# Patient Record
Sex: Female | Born: 1960 | Race: White | Hispanic: No | Marital: Single | Smoking: Current every day smoker
Health system: Southern US, Community
[De-identification: ages and names within clinical notes are randomized; demographics above are authoritative.]

## PROBLEM LIST (undated history)

## (undated) DIAGNOSIS — G894 Chronic pain syndrome: Secondary | ICD-10-CM

## (undated) DIAGNOSIS — M961 Postlaminectomy syndrome, not elsewhere classified: Secondary | ICD-10-CM

## (undated) DIAGNOSIS — M1990 Unspecified osteoarthritis, unspecified site: Secondary | ICD-10-CM

## (undated) DIAGNOSIS — M4800 Spinal stenosis, site unspecified: Secondary | ICD-10-CM

## (undated) DIAGNOSIS — M51369 Other intervertebral disc degeneration, lumbar region without mention of lumbar back pain or lower extremity pain: Secondary | ICD-10-CM

## (undated) DIAGNOSIS — M546 Pain in thoracic spine: Secondary | ICD-10-CM

## (undated) DIAGNOSIS — E7800 Pure hypercholesterolemia, unspecified: Secondary | ICD-10-CM

## (undated) DIAGNOSIS — T8859XA Other complications of anesthesia, initial encounter: Secondary | ICD-10-CM

## (undated) DIAGNOSIS — Z87898 Personal history of other specified conditions: Secondary | ICD-10-CM

## (undated) DIAGNOSIS — J439 Emphysema, unspecified: Secondary | ICD-10-CM

## (undated) DIAGNOSIS — K219 Gastro-esophageal reflux disease without esophagitis: Secondary | ICD-10-CM

## (undated) DIAGNOSIS — M542 Cervicalgia: Secondary | ICD-10-CM

## (undated) DIAGNOSIS — G43909 Migraine, unspecified, not intractable, without status migrainosus: Secondary | ICD-10-CM

## (undated) DIAGNOSIS — J449 Chronic obstructive pulmonary disease, unspecified: Secondary | ICD-10-CM

## (undated) DIAGNOSIS — M5136 Other intervertebral disc degeneration, lumbar region: Secondary | ICD-10-CM

## (undated) HISTORY — DX: Unspecified osteoarthritis, unspecified site: M19.90

## (undated) HISTORY — PX: APPENDECTOMY: SHX54

## (undated) HISTORY — DX: Other intervertebral disc degeneration, lumbar region without mention of lumbar back pain or lower extremity pain: M51.369

## (undated) HISTORY — DX: Other intervertebral disc degeneration, lumbar region: M51.36

## (undated) HISTORY — DX: Postlaminectomy syndrome, not elsewhere classified: M96.1

## (undated) HISTORY — PX: OTHER SURGICAL HISTORY: SHX169

## (undated) HISTORY — DX: Other complications of anesthesia, initial encounter: T88.59XA

## (undated) HISTORY — DX: Personal history of other specified conditions: Z87.898

## (undated) HISTORY — DX: Pure hypercholesterolemia, unspecified: E78.00

## (undated) HISTORY — DX: Spinal stenosis, site unspecified: M48.00

## (undated) HISTORY — DX: Chronic pain syndrome: G89.4

## (undated) HISTORY — DX: Pain in thoracic spine: M54.6

## (undated) HISTORY — DX: Cervicalgia: M54.2

## (undated) HISTORY — DX: Chronic obstructive pulmonary disease, unspecified: J44.9

## (undated) HISTORY — PX: BREAST CYST EXCISION: SHX579

## (undated) HISTORY — DX: Emphysema, unspecified: J43.9

## (undated) HISTORY — DX: Gastro-esophageal reflux disease without esophagitis: K21.9

## (undated) HISTORY — PX: TONSILLECTOMY: SUR1361

## (undated) HISTORY — DX: Migraine, unspecified, not intractable, without status migrainosus: G43.909

---

## 1999-01-16 ENCOUNTER — Encounter (HOSPITAL_BASED_OUTPATIENT_CLINIC_OR_DEPARTMENT_OTHER): Payer: Self-pay | Admitting: General Surgery

## 1999-01-16 ENCOUNTER — Encounter: Admission: RE | Admit: 1999-01-16 | Discharge: 1999-01-16 | Payer: Self-pay | Admitting: General Surgery

## 1999-01-17 ENCOUNTER — Encounter (INDEPENDENT_AMBULATORY_CARE_PROVIDER_SITE_OTHER): Payer: Self-pay | Admitting: Specialist

## 1999-01-17 ENCOUNTER — Ambulatory Visit (HOSPITAL_BASED_OUTPATIENT_CLINIC_OR_DEPARTMENT_OTHER): Admission: RE | Admit: 1999-01-17 | Discharge: 1999-01-17 | Payer: Self-pay | Admitting: General Surgery

## 2004-08-01 ENCOUNTER — Ambulatory Visit: Payer: Self-pay | Admitting: Unknown Physician Specialty

## 2004-08-06 ENCOUNTER — Ambulatory Visit: Payer: Self-pay | Admitting: Unknown Physician Specialty

## 2004-11-29 ENCOUNTER — Ambulatory Visit: Payer: Self-pay | Admitting: Unknown Physician Specialty

## 2005-11-05 ENCOUNTER — Ambulatory Visit: Payer: Self-pay | Admitting: Internal Medicine

## 2005-11-08 ENCOUNTER — Encounter: Payer: Self-pay | Admitting: Internal Medicine

## 2005-11-08 LAB — CONVERTED CEMR LAB

## 2006-02-19 ENCOUNTER — Encounter: Admission: RE | Admit: 2006-02-19 | Discharge: 2006-02-19 | Payer: Self-pay | Admitting: *Deleted

## 2006-07-07 ENCOUNTER — Ambulatory Visit: Payer: Self-pay | Admitting: Internal Medicine

## 2006-12-01 ENCOUNTER — Emergency Department (HOSPITAL_COMMUNITY): Admission: EM | Admit: 2006-12-01 | Discharge: 2006-12-01 | Payer: Self-pay | Admitting: Emergency Medicine

## 2006-12-07 ENCOUNTER — Inpatient Hospital Stay (HOSPITAL_COMMUNITY): Admission: EM | Admit: 2006-12-07 | Discharge: 2006-12-08 | Payer: Self-pay | Admitting: Emergency Medicine

## 2006-12-07 ENCOUNTER — Ambulatory Visit: Payer: Self-pay | Admitting: Infectious Diseases

## 2006-12-07 ENCOUNTER — Ambulatory Visit: Payer: Self-pay | Admitting: Cardiology

## 2006-12-08 ENCOUNTER — Encounter: Payer: Self-pay | Admitting: Infectious Diseases

## 2006-12-15 ENCOUNTER — Ambulatory Visit: Payer: Self-pay

## 2006-12-28 ENCOUNTER — Ambulatory Visit: Payer: Self-pay | Admitting: Cardiology

## 2006-12-31 ENCOUNTER — Ambulatory Visit: Payer: Self-pay

## 2006-12-31 LAB — CONVERTED CEMR LAB
ALT: 12 units/L (ref 0–35)
Bilirubin, Direct: 0.1 mg/dL (ref 0.0–0.3)
Cholesterol: 218 mg/dL (ref 0–200)
Direct LDL: 128.5 mg/dL
Total CHOL/HDL Ratio: 5.2
Total Protein: 6.8 g/dL (ref 6.0–8.3)
Triglycerides: 256 mg/dL (ref 0–149)
VLDL: 51 mg/dL — ABNORMAL HIGH (ref 0–40)

## 2007-01-27 ENCOUNTER — Ambulatory Visit: Payer: Self-pay | Admitting: Internal Medicine

## 2007-01-27 DIAGNOSIS — K219 Gastro-esophageal reflux disease without esophagitis: Secondary | ICD-10-CM

## 2007-01-27 DIAGNOSIS — E785 Hyperlipidemia, unspecified: Secondary | ICD-10-CM | POA: Insufficient documentation

## 2007-01-27 DIAGNOSIS — Z860100 Personal history of colon polyps, unspecified: Secondary | ICD-10-CM | POA: Insufficient documentation

## 2007-01-27 DIAGNOSIS — R5383 Other fatigue: Secondary | ICD-10-CM

## 2007-01-27 DIAGNOSIS — K589 Irritable bowel syndrome without diarrhea: Secondary | ICD-10-CM

## 2007-01-27 DIAGNOSIS — K279 Peptic ulcer, site unspecified, unspecified as acute or chronic, without hemorrhage or perforation: Secondary | ICD-10-CM | POA: Insufficient documentation

## 2007-01-27 DIAGNOSIS — J4489 Other specified chronic obstructive pulmonary disease: Secondary | ICD-10-CM | POA: Insufficient documentation

## 2007-01-27 DIAGNOSIS — N809 Endometriosis, unspecified: Secondary | ICD-10-CM | POA: Insufficient documentation

## 2007-01-27 DIAGNOSIS — F329 Major depressive disorder, single episode, unspecified: Secondary | ICD-10-CM

## 2007-01-27 DIAGNOSIS — K224 Dyskinesia of esophagus: Secondary | ICD-10-CM

## 2007-01-27 DIAGNOSIS — J449 Chronic obstructive pulmonary disease, unspecified: Secondary | ICD-10-CM

## 2007-01-27 DIAGNOSIS — R011 Cardiac murmur, unspecified: Secondary | ICD-10-CM

## 2007-01-27 DIAGNOSIS — F3289 Other specified depressive episodes: Secondary | ICD-10-CM | POA: Insufficient documentation

## 2007-01-27 DIAGNOSIS — R5381 Other malaise: Secondary | ICD-10-CM

## 2007-01-27 LAB — CONVERTED CEMR LAB: TSH: 2 microintl units/mL (ref 0.35–5.50)

## 2008-05-11 ENCOUNTER — Ambulatory Visit: Payer: Self-pay

## 2008-12-02 ENCOUNTER — Ambulatory Visit: Payer: Self-pay

## 2008-12-20 ENCOUNTER — Ambulatory Visit: Payer: Self-pay | Admitting: Gastroenterology

## 2008-12-26 ENCOUNTER — Ambulatory Visit: Payer: Self-pay | Admitting: Pain Medicine

## 2009-01-16 ENCOUNTER — Ambulatory Visit: Payer: Self-pay | Admitting: Physician Assistant

## 2009-01-23 ENCOUNTER — Ambulatory Visit: Payer: Self-pay | Admitting: Pain Medicine

## 2009-01-24 ENCOUNTER — Ambulatory Visit: Payer: Self-pay | Admitting: Internal Medicine

## 2009-02-08 ENCOUNTER — Ambulatory Visit: Payer: Self-pay | Admitting: Physician Assistant

## 2009-02-20 ENCOUNTER — Ambulatory Visit: Payer: Self-pay | Admitting: Pain Medicine

## 2010-03-27 ENCOUNTER — Telehealth (INDEPENDENT_AMBULATORY_CARE_PROVIDER_SITE_OTHER): Payer: Self-pay | Admitting: *Deleted

## 2010-04-09 ENCOUNTER — Ambulatory Visit: Payer: Self-pay | Admitting: Pain Medicine

## 2010-04-11 NOTE — Progress Notes (Signed)
  Faxed Stress/Echo over to Dr.Fleming @ [facility] @[phone] Mailed copy out to Pt,ROI on FIle  [facility]  March 27, 2010 12:45 PM'

## 2010-07-23 NOTE — Op Note (Signed)
[name], Tamara [name]                ACCOUNT NO.:  [id number]   MEDICAL RECORD NO.:  [id number]          PATIENT TYPE:  INP   LOCATION:  3707                         FACILITY:  [facility]   PHYSICIAN:  John C. Madilyn Fireman, M.D.    DATE OF BIRTH:  1961-01-15   DATE OF PROCEDURE:  12/08/2006  DATE OF DISCHARGE:                               OPERATIVE REPORT   ESOPHAGOGASTRODUODENOSCOPY WITH ESOPHAGEAL DILATATION:   INDICATIONS FOR PROCEDURE:  Dysphagia, epigastric and chest pain, not  felt to be of cardiac origin.   PROCEDURE:  The patient was placed in the left lateral decubitus  position and placed on the pulse monitor with continuous low-flow oxygen  delivered by nasal cannula.  She was sedated with 100 mcg IV fentanyl  and 10 mg IV Versed.  The Olympus video endoscope was advanced under  direct vision into the oropharynx and esophagus.  The esophagus was  straight and of normal caliber with the squamocolumnar line at 38 cm.  There was a 1.5-2-cm sliding hiatal hernia and one small erosion without  exudate.  There was a lot of spasm near the GE junction.  It was  difficult to discern whether there was any esophageal ring or stricture.  This was questionable on one view when there was partial relaxation of  the LES.  The stomach was entered and a small amount of liquid  secretions were suctioned from the fundus.  Retroflexed view of the  cardia was unremarkable.  The fundus, body, antrum and pylorus appeared  normal.  The duodenum was entered and both the bulb and second portion  were well-inspected and appeared be within normal limits.  A Savary  guidewire was placed through the endoscope channel and the scope  withdrawn.  A single 17-mm Savary dilator was passed over the guidewire  with mild resistance and no blood seen on withdrawal.  The dilator was  removed together with the wire and the patient returned to the recovery  room in stable condition.  She tolerated the procedure well and  there  were no immediate complications.   IMPRESSION:  1. Small to moderate hiatal hernia.  2. Questionable subtle lower esophageal ring, status post empiric      dilatation.   PLAN:  Continues Zegerid b.i.d. and dietary and lifestyle modifications  for reflux.  Soft mechanical diet for 1 day.           ______________________________  Everardo All. Madilyn Fireman, M.D.     JCH/MEDQ  D:  12/08/2006  T:  12/08/2006  Job:  [id number]

## 2010-07-23 NOTE — Consult Note (Signed)
Tamara [name], FANARA                ACCOUNT NO.:  [id number]   MEDICAL RECORD NO.:  [id number]          PATIENT TYPE:  INP   LOCATION:  3707                         FACILITY:  [facility]   PHYSICIAN:  John C. Madilyn Fireman, M.D.    DATE OF BIRTH:  Dec 29, 1960   DATE OF CONSULTATION:  12/08/2006  DATE OF DISCHARGE:                                 CONSULTATION   We were asked to see Tamara [name] today in consultation for noncardiac  chest pain by Dr. Lina Sayre.   HISTORY OF PRESENT ILLNESS:  This is a 50 year old female with a history  of tobacco abuse whose been experiencing intermittent pain x1 month.  She says that she started having a bad cough in August and her  epigastric pain became worse at that point in time. She has no  difficulty swallowing, no hematemesis, melena, hematochezia. She says  she has experienced increased nausea but no vomiting yet. She takes no  NSAIDs at home. She does have a history of severe GERD, has tried  multiple PPIs,  states that she follows the lifestyle recommendations to  decrease GERD such as not eating 3 hours before bedtime, avoiding  alcohol, greasy foods, chocolates and peppermint.   PAST MEDICAL HISTORY:  Significant for tobacco abuse, COPD with  emphysema, GERD, in 2,000 she had a right breast cyst excised. She is  status post hysterectomy approximately 2003. Dr. Mechele Collin in [place]  performed an EGD and found a hiatal hernia as well as a gastric ulcer  and she was dilated at that time.   CURRENT MEDICATIONS:  Zegerid b.i.d., Cenestin,  Z-Pak, albuterol.   ALLERGIES:  She has no known drug allergies.   REVIEW OF SYSTEMS:  Significant only for cough.   SOCIAL HISTORY:  She smokes about a half pack a day and has for the past  25 years. She reports no drug use or alcohol use.   FAMILY HISTORY:  Negative for ulcer disease.   PHYSICAL EXAMINATION:  She is alert and oriented, currently somewhat  distracted by her roommate.  VITAL SIGNS:  Temperature  97.6, pulse 74, respirations are 20, blood  pressure is 97/67.  HEART:  Regular rate and rhythm.  ABDOMEN:  Soft, nontender, nondistended with good bowel sounds.  The  patient reports she has pain in her epigastrium.   LABORATORY DATA:  Hemoglobin 12.4, hematocrit 35.7, white count 7.7,  platelets 248,000.  Chem-7 is within normal limits.  Cardiac enzymes are  negative.  PTT is 29.  PT is 27.  Ultrasound of her abdomen was normal  with no gallstones or cholecystitis. Her CT angio of her chest showed no  pulmonary embolus but she did have positive changes of emphysema.   ASSESSMENT:  Dr. Dorena Cookey has seen and examined the patient, his  impression is that this is a 50 year old female with COPD with emphysema  as well as ongoing epigastric pain that seems to be insensitive to PPIs.  She has a history of GERD and ulcer disease.  Will plan for upper  endoscopy this morning.  Thanks very much for this consultation.  Stephani Police, PA    ______________________________  Everardo All Madilyn Fireman, M.D.    MLY/MEDQ  D:  12/08/2006  T:  12/08/2006  Job:  [id number]   cc:   Lynnae Prude  Fransisco Hertz, M.D.

## 2010-07-23 NOTE — Discharge Summary (Signed)
Tamara Tamara [name], Tamara [name]                ACCOUNT NO.:  [id number]   MEDICAL RECORD NO.:  [id number]          PATIENT TYPE:  INP   LOCATION:  3707                         FACILITY:  [facility]   PHYSICIAN:  Fransisco Hertz, M.D.  DATE OF BIRTH:  05/24/1960   DATE OF ADMISSION:  12/07/2006  DATE OF DISCHARGE:  12/08/2006                               DISCHARGE SUMMARY   DISCHARGE DIAGNOSES:  1. Noncardiac chest heaviness and left-sided chest pain likely      secondary to esophageal spasm.  2. Chronic obstructive pulmonary disease.  3. Gastroesophageal reflux disease with evidence of hiatal hernia.  4. Dyslipidemia.  5. Insomnia.  6. Irritable bowel syndrome.  7. History of endometriosis.  8. History of colonoscopy with 2 polyps removed.  9. History of hysterectomy performed in 1990.  10.Post menopausal and on estrogen.  11.Removal of lipoma from her right breast in 2000.   DISCHARGE MEDICATIONS:  1. Cenestin 1.25 mg once daily.  2. Protonix 40 mg twice daily.  3. Albuterol inhaler 9 bm cg every 4 hours as needed.  4. Advair 250/25 one puff twice daily.  5. Hyoscyamine 0.375 mg p.o. twice daily as needed for chest pain.   DISPOSITION AND FOLLOWUP:  The patient will followup with Dr. Madilyn Fireman at  [facility] on December 22, 2006 at 2:45 p.m.  She will have her reflux  symptoms, likely secondary to esophageal spasms, followed up on.  The  patient will also followup at [facility] on December 28, 2006 at  3:30 p.m.  She will continue her cardiac workup which has so far been  negative.  She is also scheduled to get a 2D echocardiogram performed on  December 18, 2006 at 1 p.m.   PROCEDURE PERFORMED:  1. Chest x-ray performed on December 07, 2006 indicating upper lobe      bolus disease consistent with emphysema.  No evidence of pleural      effusion.  Heart size and mediastinal contours are normal.  No      masses or adenopathy found.  2. CT angiogram negative for any central lobular  emphysema.  3. Abdominal ultrasound negative for any sign of hepatobiliary      pathology.  4. EGD with esophageal dilation performed on December 08, 2006 by Dr.      Dorena Cookey.  The procedure was positive for a mild hiatal hernia      with a questionable subtle lower esophageal ring status post      empiric dilation.   CONSULTATIONS:  The patient was consulted by [facility] and was  consulted by [facility].   BRIEF ADMITTING HISTORY AND PHYSICAL:  This is a 50 year old female with  history of GERD and hiatal hernia who is post menopausal, who is coming  in with history of recent chest heaviness and chronic nonproductive  cough.  She has also has intermittent left-sided sharp chest pains that  last 5 to 10 minutes and occur frequently throughout the day.  She has  had no alleviating or worsening factors that she knows of for either her  chest pain  or chest heaviness.  She has significant shortness of breath  and dyspnea on exertion that accompanied some of her chest heaviness  recently.  Of note though, she does get her symptoms of chest heaviness  and chest pain at rest as well.  Additionally, she has had palpitations  for several years.  No recent nausea, vomiting, or diarrhea.  No recent  change in her bowel habits.  She denies any fevers or chills recently.  The patient notes that her left-sided sharp chest pains are worsened  acutely while eating.  The patient does have epigastric pain  occasionally related to her gastroesophageal reflux, but she indicates  that this type of pain is distinctly different from the chest pain and  chest heaviness that she has recently experienced.  She takes Nexium  and/or Zegerid for her gastroesophageal reflux disease, and neither seem  to be helping the recent chest pain and chest heaviness that she has  felt.   HOSPITAL COURSE BY PROBLEM:  1. Chest heaviness: Initially the patient obtained a chest x-ray that      showed  chronic obstructive pulmonary disease changes.  No other      chest pathology was noted including masses or signs of pneumonia.      The patient also had an EKG that was negative for any acute      ischemia.  Point of care cardiac enzymes were negative x1.  She      also had a CT angiogram that ruled out pulmonary embolus.  The      patient was admitted to telemetry and her enzymes were cycled x3.      Her cardiac enzymes continued to be negative.  A cardiology consult      was obtained in the emergency room before she went to the floor.      The [place] cardiologist recommended an output Myoview, but doubted      that her pain and chest heaviness were related to cardiac chest      pain.  He thought that the chest pain was related to      gastroesophageal reflux disease, or another gastrointestinal      pathology.  He recommended a gastrointestinal consultation.  [facility]      Gastroenterology consulted the patient and agreed to do an EGD.      The patient had a significant esophageal ring and was empirically      dilated.  The patient was thought to have significant esophageal      spasm that contributed to her pain.  Upon discharge, she was placed      on hyoscyamine as needed for chest pain.  She was no longer      experiencing chest pain or chest heaviness upon discharge.  She was      discharged with an outpatient followup appointment with both      cardiology at [facility] and at [facility].  She will follow      up with those 2 specialists for continued workup of her recurrent      chest pain.  She will also follow up with the [facility] clinic and      her primary care doctor there for management of her other chronic      medical problems.  2. Chronic obstructive pulmonary disease:  The patient is a former      smoker, but she smoked a total of 26-pack/years.  She has      emphysematous changes  seen on both CT and chest x-ray.  We placed      the patient on Advair and albuterol  during her hospitalization.      The patient had been experiencing some chronic shortness of breath      symptoms for quite a while, and noticed that these symptoms      improved dramatically during her hospitalization.  She will, again,      follow up with her primary care doctor at the [facility] clinic for      continued management of her chronic obstructive pulmonary disease.      She will need PFTs at that time.   DISCHARGE LABS:  Sodium 140, potassium 3.5, chloride 106, CO2 29, BUN 3,  creatinine 0.83, glucose 79.  Hemoglobin 12.4, white count 7.7,  platelets 248,000.   DISCHARGE VITALS:  Temperature 96.9, pulse 88, respiratory rate 16,  blood pressure 106/79, satting 95% on room air.      Lollie Sails, MD  Electronically Signed      Fransisco Hertz, M.D.  Electronically Signed    CB/MEDQ  D:  12/19/2006  T:  12/20/2006  Job:  [id number]

## 2010-07-23 NOTE — Assessment & Plan Note (Signed)
[facility]                            CARDIOLOGY OFFICE NOTE   ELMA, SHANDS                       MRN:          [id number]  DATE:12/28/2006                            DOB:          05/22/1960    PRIMARY CARE PHYSICIAN:  Corwin Levins, MD.   REASON FOR PRESENTATION:  Evaluate patient with chest pain.   HISTORY OF PRESENT ILLNESS:  The patient is a very pleasant 50 year old  white female who was seen by Dr. Myrtis Ser in consultation in the emergency  room.  She was admitted to the hospital.  She apparently ruled out for a  myocardial infarction.  She did have a GI evaluation and was found to  have gastroesophageal reflux with evidence of a hiatal hernia.  There  was also mention of an esophageal ring  She has since been treated with  Hyomax SR 0.375 mg b.i.d.  She says that the discomfort in her chest is  somewhat better.  She did have a follow-up echocardiogram  at Dr. Henrietta Hoover  suggestion which demonstrated no abnormalities.  She had normal EF.   She now returns for follow-up.  She does still describe some chest  discomfort.  This happens with walking.  She also gets short of breath  doing such things as climbing up and down stairs or even less exerting  activity.  Again, this is slightly better since her GI procedure and  treatment that is still ongoing.  She does not have radiation of her  discomfort to her jaw or to her arms.  She does not notice this at rest.  She is not having any PND or orthopnea.  She is not having any  palpitations, presyncope or syncope.   PAST MEDICAL HISTORY:  1. Hyperlipidemia (she says she has been told this in the past but it      has not been treated.  2. Tobacco abuse.   PAST SURGICAL HISTORY:  1. Hysterectomy for endometriosis.  2. Removal of breast cyst.   MEDICATIONS:  1. Protonix 40 mg b.i.d.  2. Hyomax SR 0.375 mg b.i.d.  3. Lanestrin 1.25 mg daily.  4. Lunesta 3 mg daily.   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  She is  single.  She has two children. The patient smokes one half pack of  cigarettes per day.  She has been smoking up to a pack per day for 26  years.   FAMILY HISTORY:  Noncontributory for early coronary artery disease.   REVIEW OF SYSTEMS:  As stated.  Positive for cough, asthma, leg cramps,  reflux, varicose veins.  Negative for other systems.   PHYSICAL EXAMINATION:  VITAL SIGNS:  Blood pressure 110/82, heart rate  81 and regular, weight 128 pounds, body mass index 24.  GENERAL APPEARANCE:  The patient is in no acute distress.  HEENT:  Eyelids unremarkable.  Pupils are equal, round and reactive to  light.  Fundi not visualized.  Oral mucosa unremarkable.  NECK:  No jugular venous distension at 45 degrees.  Carotid upstrokes  brisk and  symmetric, no bruits, no thyromegaly.  LYMPHATICS:  No cervical, axillary or inguinal adenopathy.  CHEST:  Unremarkable.  LUNGS:  Clear to auscultation bilaterally.  BACK:  No costovertebral angle tenderness.  CARDIOVASCULAR:  PMI not displaced or sustained.  S1 and S2 within  normal limits.  No S3, no S4, no clicks, no rubs, no murmurs.  ABDOMEN:  Obese, positive bowel sounds, normal in frequency and pitch,  no bruits, no rebound, no guarding, no midline pulsatile mass, no  organomegaly.  SKIN:  No rashes, no nodules.  EXTREMITIES:  Pulses 2+, no edema.   EKG with sinus rhythm, rate 84, axis within normal limits, intervals  within normal limits, questionable atrial enlargement, no acute ST wave  changes.   ASSESSMENT/PLAN:  1. Dyspnea and chest discomfort:  The patient has concerning symptoms      for obstructive coronary disease.  She has significant      cardiovascular risk factors, in particular her smoking.  Therefore,      I do think it is prudent to screen her with a stress perfusion      study which was a suggestion of Dr. Myrtis Ser at the time of his      evaluation.  She will come back and  have this done.  2. Dyslipidemia:  This has not been treated.  She will get a lipid      profile when she comes back at the time of her treadmill.  3. Tobacco:  She has been counseled about the need to stop smoking.  4. Insomnia:  Per primary care physician.  She says she is going to      start seeing Dr. Jonny Ruiz.  5. Follow-up:  I will see her back based on the results of the above.     Rollene Rotunda, MD, [facility]  Electronically Signed    JH/MedQ  DD: 12/28/2006  DT: 12/29/2006  Job #: [id number]

## 2010-07-23 NOTE — Consult Note (Signed)
NAMEDEZIAH, RENWICK                ACCOUNT NO.:  [id number]   MEDICAL RECORD NO.:  [id number]          PATIENT TYPE:  INP   LOCATION:  3707                         FACILITY:  [facility]   PHYSICIAN:  Luis Abed, MD, FACCDATE OF BIRTH:  Sep 06, 1960   DATE OF CONSULTATION:  12/07/2006  DATE OF DISCHARGE:                                 CONSULTATION   PRIMARY CARDIOLOGIST:  The patient is seen by [facility]. She is  scheduled to see Dr. Antoine Poche in [place].   PRIMARY CARE PROVIDERS:  Dr. Dareen Piano at [facility] in [place].   PATIENT PROFILE:  A 50 year old Caucasian female with history of tobacco  abuse and progressive dyspnea on exertion as well as chest discomfort  who presents with chest pain,   PROBLEM LIST:  1. Chest pain.  2. History of right breast mass status post excision November 2000.  3. Gastroesophageal reflux disease.  4. Ongoing tobacco abuse with a 25-pack-year history, currently      smoking 1/2 pack a day.  5. Status post total hysterectomy and bilateral salpingo-oophorectomy      in 1990.  6. Chronic obstructive pulmonary disease.   HISTORY OF PRESENT ILLNESS:  A 50 year old  Caucasian female without  prior history of cardiac disease.  She reports a 2-year history of  intermittent duress and exertional chest tightness associated with  shortness of breath and occasional palpitations.  Symptoms previously  occurred about once every 2-3 months, but since June 2008 symptoms have  occurred twice a month.  Approximately 2 weeks ago she was [place] the beach  and just lying down to go to sleep at night when she had sudden onset of  10/10 chest tightness with shortness of breath.  There were no  palpitations on that occasion.  Symptoms lasted about 10 minutes and  resolved spontaneously.  Subsequent to that episode, she has had more or  less constant chest discomfort to some degree, generally around 5/10.  It worsens at times, and since Friday she has  noticed worsening of her  discomfort with swallowing of liquids and solids.  Yesterday and last  night she was coughing quite a bit and developed mid scapular  discomfort.  This persisted throughout the night and this morning,  prompting her to present to [facility] ED.  Here she was treated with  morphine and feels a little bit better.  She still is coughing quite a  bit and just received an albuterol treatment.   The patient reports at least a two to three-year history of progressive  dyspnea on exertion and says she is only walk about 5-10 feet now prior  to becoming dyspneic.  She does not do a lot of activity home.  She does  continue to smoke.  She was seen here in the ED September 23 for cough  and at that time was treated with Z-Pak as well as given an albuterol  MDI.  She also says that the palpitations she experiences have increased  in frequency over the past 2 weeks and questions whether or not this  could be secondary to  the albuterol.   ALLERGIES:  No known drug allergies.   HOME MEDICATIONS:  1. Cenestin daily.  2. Zegerid daily.  3. Z-Pak.  4. Albuterol MDI p.r.n.   FAMILY HISTORY:  Mother died of CAD and thoracic aneurysm was well as  multiple myeloma at age 17.  Father died of heart failure and lung  cancer at age 87.  She has five brothers who are alive and well.  There  is no CAD, diabetes or CVA in her siblings.   SOCIAL HISTORY:  She lives in [place] with her fiance.  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  She  is a 25-pack-year history of tobacco abuse and currently smokes 1/2 pack  a day.  She denies any alcohol or drug use.  She is not routinely  exercising.   REVIEW OF SYSTEMS:  Positive for chest pain, shortness breath, dyspnea  exertion, palpitations, cough. Otherwise, all systems reviewed and  negative.   PHYSICAL EXAMINATION:  VITAL SIGNS:  Temperature 97.4, heart rate 95,  respirations 20, blood  pressure 130/86, pulse oximetry 97% on room air.  GENERAL:  Pleasant white female in no acute distress, awake, alert and  oriented x3.  NECK:  No bruits or JVD.  LUNGS:  Respirations are regular and unlabored with few rhonchi at the  bases. Otherwise, clear to auscultation.  CARDIAC:  Regular S1 and S2.  No S3, S4, or murmurs.  ABDOMEN:  Round, soft, nontender, nondistended.  Bowel sounds present  x4.  EXTREMITIES:  Warm, dry, pink.  No clubbing, cyanosis or edema.  Dorsalis pedis and posterior tibial pulses 2+ and equal bilaterally.   ACCESSORY CLINICAL FINDINGS:  ECG shows sinus rhythm at a rate of 95  with no acute ST-T changes.  Normal axis.   Hemoglobin 13.4, hematocrit 39.4, WBC 10.7, platelets 272.  Sodium 137,  potassium 3.6, chloride 103, CO2 27.7, BUN 5, creatinine 0.8, glucose  91. D-dimer 0.49.  CK-MB less 1.0.  Troponin-I less than 0.5.   ASSESSMENT AND PLAN:  1. Chest pain, mostly atypical in nature.  She has had some degree of      chest pain for the past 2 weeks constantly.  Doubt cardiac,      especially in light of negative first set of cardiac markers and a      normal ECG.  Patient likely will require an outpatient Myoview      which we can set up in [place].  If patient is admitted by the      teaching team, would recommend 2-D echocardiogram to evaluate LV      function.  2. Palpitations.  The patient does report a several-year history of      palpitations associated with shortness of breath, lasting about 3-5      minutes and resolving spontaneously.  Symptoms apparently have      occurred more frequently since being placed on albuterol MDI.      Would recommend switching that to Xopenex.  We would plan on      outpatient [facility] to further evaluate symptoms of palpitations      and then possibly consider calcium channel blocker versus beta      blocker.  Beta blocker may not be the best choice in the setting of      her COPD and bronchitis.  3.  Dyspnea.  This was probably secondary to chronic obstructive      pulmonary  disease and bronchitis.  Recommend smoking cessation.      Switch albuterol to Xopenex, and she just finished a Z-Pak.  4. Dysphasia.  This has been going on since Friday.  Continue proton      pump inhibitor.  Consider outpatient GI evaluation.  5. Chronic obstructive pulmonary disease.  Tobacco cessation advised.      See above.      Nicolasa Ducking, ANP      Luis Abed, MD, [facility]  Electronically Signed    CB/MEDQ  D:  12/07/2006  T:  12/07/2006  Job:  [id number]

## 2010-07-26 NOTE — Op Note (Signed)
[place]  Patient:    Tamara [name]                          MRN: [id number] Proc. Date: 01/17/99 Adm. Date:  [id number] Attending:  Fortino Sic                           Operative Report  PREOPERATIVE DIAGNOSIS:  Mass, right breast.  POSTOPERATIVE DIAGNOSIS:  Mass, right breast.  OPERATION PERFORMED:  Excision, right breast mass.  SURGEON:  Marnee Spring. Wiliam Ke, M.D.  ASSISTANT:  None.  ANESTHESIA:  General by hospital.  DESCRIPTION OF PROCEDURE:  A good general anesthesia, skin and breast were prepped and draped in the usual manner.  An incision was made over the palpable mass and deep subcutaneous tissue.  The mass was excised, along with surrounding normal breast tissue.  Hemostasis was obtained with electrocautery current.  The wound was closed with subcutaneous 3-0 Vicryl and subcuticular 4-0 Dexon.  Steri-Strips were applied.  Estimated blood loss was minimal.  The patient received no blood and eft the operating room in satisfactory condition after sponge and needle counts were verified. DD:  01/17/99 TD:  01/19/99 Job: [id number]

## 2010-08-09 ENCOUNTER — Ambulatory Visit: Payer: Self-pay | Admitting: Internal Medicine

## 2010-08-29 ENCOUNTER — Ambulatory Visit: Payer: Self-pay | Admitting: Pain Medicine

## 2010-12-19 LAB — DIFFERENTIAL
Basophils Absolute: 0.1
Basophils Relative: 1
Eosinophils Absolute: 0.1
Eosinophils Absolute: 0.1
Eosinophils Relative: 1
Lymphocytes Relative: 18
Lymphocytes Relative: 27
Lymphs Abs: 1.9
Lymphs Abs: 2.1
Monocytes Absolute: 0.7
Monocytes Relative: 7
Neutro Abs: 4.7
Neutro Abs: 8 — ABNORMAL HIGH
Neutrophils Relative %: 61
Neutrophils Relative %: 74

## 2010-12-19 LAB — CBC
HCT: 38.1
HCT: 39.4
Hemoglobin: 13.2
MCHC: 34.6
MCV: 88.4
MCV: 89.6
Platelets: 248
Platelets: 258
Platelets: 272
RBC: 3.98
RBC: 4.31
RDW: 13.2
RDW: 13.4
WBC: 10.7 — ABNORMAL HIGH
WBC: 10.8 — ABNORMAL HIGH
WBC: 7.7

## 2010-12-19 LAB — BASIC METABOLIC PANEL
CO2: 29
Chloride: 106
GFR calc Af Amer: >60
Potassium: 3.5
Sodium: 140

## 2010-12-19 LAB — I-STAT 8, (EC8 V) (CONVERTED LAB)
Acid-Base Excess: 3 — ABNORMAL HIGH
BUN: 5 — ABNORMAL LOW
Bicarbonate: 27.7 — ABNORMAL HIGH
Chloride: 103
Glucose, Bld: 91
HCT: 41
Hemoglobin: 13.9
Operator id: 288331
Potassium: 3.6
Sodium: 137
TCO2: 29
pCO2, Ven: 42.5 — ABNORMAL LOW
pH, Ven: 7.423 — ABNORMAL HIGH

## 2010-12-19 LAB — COMPREHENSIVE METABOLIC PANEL WITH GFR
ALT: 14
Calcium: 9.2
GFR calc Af Amer: >60
Glucose, Bld: 95
Sodium: 139
Total Protein: 6.7

## 2010-12-19 LAB — CARDIAC PANEL(CRET KIN+CKTOT+MB+TROPI)
CK, MB: 1.7
Relative Index: INVALID
Relative Index: INVALID
Total CK: 51
Total CK: 61
Troponin I: 0.01
Troponin I: 0.02

## 2010-12-19 LAB — D-DIMER, QUANTITATIVE: D-Dimer, Quant: 0.49 — ABNORMAL HIGH

## 2010-12-19 LAB — POCT CARDIAC MARKERS
CKMB, poc: <1 — ABNORMAL LOW
Myoglobin, poc: 54.5
Operator id: 288331
Troponin i, poc: <0.05

## 2010-12-19 LAB — COMPREHENSIVE METABOLIC PANEL
AST: 18
Albumin: 3.7
Alkaline Phosphatase: 82
BUN: 3 — ABNORMAL LOW
CO2: 29
Chloride: 101
Creatinine, Ser: 0.57
GFR calc non Af Amer: >60
Potassium: 3.5
Total Bilirubin: 0.2 — ABNORMAL LOW

## 2010-12-19 LAB — TROPONIN I: Troponin I: 0.02

## 2010-12-19 LAB — CK TOTAL AND CKMB (NOT AT ARMC): Relative Index: INVALID

## 2010-12-19 LAB — POCT I-STAT CREATININE
Creatinine, Ser: 0.8
Operator id: 288331

## 2012-10-20 ENCOUNTER — Ambulatory Visit: Payer: Self-pay | Admitting: Pain Medicine

## 2012-10-20 LAB — MAGNESIUM: Magnesium: 1.8 mg/dL

## 2012-10-28 ENCOUNTER — Ambulatory Visit: Payer: Self-pay | Admitting: Pain Medicine

## 2012-11-12 ENCOUNTER — Ambulatory Visit: Payer: Self-pay | Admitting: Unknown Physician Specialty

## 2013-01-12 ENCOUNTER — Ambulatory Visit: Payer: Self-pay | Admitting: Pain Medicine

## 2013-03-14 SURGERY — Surgical Case
Anesthesia: *Unknown

## 2013-06-08 HISTORY — PX: NECK SURGERY: SHX720

## 2013-07-27 ENCOUNTER — Ambulatory Visit: Payer: Self-pay | Admitting: Internal Medicine

## 2014-03-30 ENCOUNTER — Ambulatory Visit: Payer: Self-pay | Admitting: Internal Medicine

## 2014-05-16 ENCOUNTER — Other Ambulatory Visit: Payer: Self-pay | Admitting: Neurological Surgery

## 2014-06-16 ENCOUNTER — Inpatient Hospital Stay (HOSPITAL_COMMUNITY): Admission: RE | Admit: 2014-06-16 | Payer: Self-pay | Source: Ambulatory Visit | Admitting: Neurological Surgery

## 2014-06-16 ENCOUNTER — Encounter (HOSPITAL_COMMUNITY): Admission: RE | Payer: Self-pay | Source: Ambulatory Visit

## 2014-06-16 SURGERY — ANTERIOR CERVICAL DECOMPRESSION/DISCECTOMY FUSION 3 LEVELS
Anesthesia: General

## 2015-05-17 ENCOUNTER — Other Ambulatory Visit
Admission: RE | Admit: 2015-05-17 | Discharge: 2015-05-17 | Disposition: A | Discharge status: Home / Self Care | Payer: Managed Care, Other (non HMO) | Source: Ambulatory Visit | Attending: Internal Medicine | Admitting: Internal Medicine

## 2015-05-17 DIAGNOSIS — R0789 Other chest pain: Secondary | ICD-10-CM | POA: Diagnosis not present

## 2015-05-17 LAB — TROPONIN I

## 2015-06-08 ENCOUNTER — Other Ambulatory Visit: Payer: Self-pay | Admitting: Physician Assistant

## 2015-06-08 DIAGNOSIS — R1319 Other dysphagia: Secondary | ICD-10-CM

## 2015-06-13 ENCOUNTER — Ambulatory Visit: Payer: Self-pay

## 2015-07-06 ENCOUNTER — Ambulatory Visit
Admission: RE | Admit: 2015-07-06 | Payer: Managed Care, Other (non HMO) | Source: Ambulatory Visit | Admitting: Unknown Physician Specialty

## 2015-07-06 ENCOUNTER — Encounter: Admission: RE | Payer: Self-pay | Source: Ambulatory Visit

## 2015-07-06 SURGERY — ESOPHAGOGASTRODUODENOSCOPY (EGD) WITH PROPOFOL
Anesthesia: General

## 2015-09-24 ENCOUNTER — Ambulatory Visit
Admission: RE | Admit: 2015-09-24 | Discharge: 2015-09-24 | Disposition: A | Discharge status: Home / Self Care | Payer: Managed Care, Other (non HMO) | Source: Ambulatory Visit | Attending: Physician Assistant | Admitting: Physician Assistant

## 2015-09-24 ENCOUNTER — Other Ambulatory Visit
Admission: RE | Admit: 2015-09-24 | Discharge: 2015-09-24 | Disposition: A | Discharge status: Home / Self Care | Payer: Managed Care, Other (non HMO) | Source: Ambulatory Visit | Attending: Physician Assistant | Admitting: Physician Assistant

## 2015-09-24 ENCOUNTER — Other Ambulatory Visit: Payer: Self-pay | Admitting: Physician Assistant

## 2015-09-24 DIAGNOSIS — R0602 Shortness of breath: Secondary | ICD-10-CM | POA: Diagnosis not present

## 2015-09-24 DIAGNOSIS — M546 Pain in thoracic spine: Secondary | ICD-10-CM | POA: Insufficient documentation

## 2015-09-24 DIAGNOSIS — M7989 Other specified soft tissue disorders: Secondary | ICD-10-CM | POA: Diagnosis present

## 2015-09-24 DIAGNOSIS — R911 Solitary pulmonary nodule: Secondary | ICD-10-CM | POA: Diagnosis not present

## 2015-09-24 DIAGNOSIS — R7989 Other specified abnormal findings of blood chemistry: Secondary | ICD-10-CM

## 2015-09-24 DIAGNOSIS — R945 Abnormal results of liver function studies: Secondary | ICD-10-CM

## 2015-09-24 LAB — FIBRIN DERIVATIVES D-DIMER (ARMC ONLY): FIBRIN DERIVATIVES D-DIMER (ARMC): 588 — AB (ref 0–499)

## 2015-09-24 MED ORDER — IOPAMIDOL (ISOVUE-370) INJECTION 76%
100.0000 mL | Freq: Once | INTRAVENOUS | Status: AC | PRN
  Administered 2015-09-24: 100 mL via INTRAVENOUS

## 2015-12-17 ENCOUNTER — Other Ambulatory Visit: Payer: Self-pay | Admitting: Internal Medicine

## 2015-12-17 DIAGNOSIS — R911 Solitary pulmonary nodule: Secondary | ICD-10-CM

## 2015-12-20 ENCOUNTER — Ambulatory Visit: Payer: Managed Care, Other (non HMO)

## 2016-01-11 ENCOUNTER — Other Ambulatory Visit: Payer: Self-pay | Admitting: Internal Medicine

## 2016-06-13 ENCOUNTER — Telehealth: Payer: Self-pay | Admitting: *Deleted

## 2016-06-13 DIAGNOSIS — Z87891 Personal history of nicotine dependence: Secondary | ICD-10-CM

## 2016-06-13 NOTE — Telephone Encounter (Signed)
Received referral for initial lung cancer screening scan. Contacted patient and obtained smoking history,(current, 36 pack year) as well as answering questions related to screening process. Patient denies signs of lung cancer such as weight loss or hemoptysis. Patient denies comorbidity that would prevent curative treatment if lung cancer were found. Patient is tentatively scheduled for shared decision making visit and CT scan on 06/18/16, pending insurance approval from business office.

## 2016-06-18 ENCOUNTER — Ambulatory Visit
Admission: RE | Admit: 2016-06-18 | Discharge: 2016-06-18 | Disposition: A | Discharge status: Home / Self Care | Payer: Managed Care, Other (non HMO) | Source: Ambulatory Visit | Attending: Oncology | Admitting: Oncology

## 2016-06-18 ENCOUNTER — Inpatient Hospital Stay: Payer: Managed Care, Other (non HMO) | Attending: Oncology | Admitting: Oncology

## 2016-06-18 ENCOUNTER — Encounter: Payer: Self-pay | Admitting: Oncology

## 2016-06-18 DIAGNOSIS — I700 Atherosclerosis of aorta: Secondary | ICD-10-CM | POA: Insufficient documentation

## 2016-06-18 DIAGNOSIS — Z122 Encounter for screening for malignant neoplasm of respiratory organs: Secondary | ICD-10-CM | POA: Diagnosis not present

## 2016-06-18 DIAGNOSIS — J439 Emphysema, unspecified: Secondary | ICD-10-CM | POA: Diagnosis not present

## 2016-06-18 DIAGNOSIS — F17210 Nicotine dependence, cigarettes, uncomplicated: Secondary | ICD-10-CM | POA: Insufficient documentation

## 2016-06-18 DIAGNOSIS — Z87891 Personal history of nicotine dependence: Secondary | ICD-10-CM

## 2016-06-18 NOTE — Progress Notes (Signed)
Personal history of tobacco use, presenting hazards to health In accordance with CMS guidelines, patient has met eligibility criteria including age, absence of signs or symptoms of lung cancer.  Social History  Substance Use Topics  . Smoking status: Current Every Day Smoker    Packs/day: 1.00    Years: 36.00  . Smokeless tobacco: Not on file  . Alcohol use Not on file     A shared decision-making session was conducted prior to the performance of CT scan. This includes one or more decision aids, includes benefits and harms of screening, follow-up diagnostic testing, over-diagnosis, false positive rate, and total radiation exposure.  Counseling on the importance of adherence to annual lung cancer LDCT screening, impact of co-morbidities, and ability or willingness to undergo diagnosis and treatment is imperative for compliance of the program.  Counseling on the importance of continued smoking cessation for former smokers; the importance of smoking cessation for current smokers, and information about tobacco cessation interventions have been given to patient including [place] and 1800 quit [place] programs.  Written order for lung cancer screening with LDCT has been given to the patient and any and all questions have been answered to the best of my abilities.   Yearly follow up will be coordinated by Burgess Estelle, Thoracic Navigator.   Lucendia Herrlich, NP  06/18/16 3:16 PM

## 2016-06-18 NOTE — Assessment & Plan Note (Signed)
In accordance with CMS guidelines, patient has met eligibility criteria including age, absence of signs or symptoms of lung cancer.  Social History  Substance Use Topics  . Smoking status: Current Every Day Smoker    Packs/day: 1.00    Years: 36.00  . Smokeless tobacco: Not on file  . Alcohol use Not on file     A shared decision-making session was conducted prior to the performance of CT scan. This includes one or more decision aids, includes benefits and harms of screening, follow-up diagnostic testing, over-diagnosis, false positive rate, and total radiation exposure.  Counseling on the importance of adherence to annual lung cancer LDCT screening, impact of co-morbidities, and ability or willingness to undergo diagnosis and treatment is imperative for compliance of the program.  Counseling on the importance of continued smoking cessation for former smokers; the importance of smoking cessation for current smokers, and information about tobacco cessation interventions have been given to patient including [place] and 1800 quit [place] programs.  Written order for lung cancer screening with LDCT has been given to the patient and any and all questions have been answered to the best of my abilities.   Yearly follow up will be coordinated by Burgess Estelle, Thoracic Navigator.

## 2016-06-20 ENCOUNTER — Encounter: Payer: Self-pay | Admitting: *Deleted

## 2016-08-15 ENCOUNTER — Other Ambulatory Visit: Payer: Self-pay | Admitting: Internal Medicine

## 2016-08-15 DIAGNOSIS — Z1231 Encounter for screening mammogram for malignant neoplasm of breast: Secondary | ICD-10-CM

## 2016-09-01 ENCOUNTER — Ambulatory Visit
Admission: RE | Admit: 2016-09-01 | Discharge: 2016-09-01 | Disposition: A | Discharge status: Home / Self Care | Payer: Managed Care, Other (non HMO) | Source: Ambulatory Visit | Attending: Internal Medicine | Admitting: Internal Medicine

## 2016-09-01 DIAGNOSIS — Z1231 Encounter for screening mammogram for malignant neoplasm of breast: Secondary | ICD-10-CM

## 2016-09-25 ENCOUNTER — Ambulatory Visit: Payer: Managed Care, Other (non HMO)

## 2016-12-19 ENCOUNTER — Ambulatory Visit: Payer: Managed Care, Other (non HMO)

## 2017-04-06 ENCOUNTER — Encounter: Payer: Self-pay | Admitting: Neurology

## 2017-04-06 ENCOUNTER — Ambulatory Visit: Payer: Managed Care, Other (non HMO) | Admitting: Neurology

## 2017-04-06 ENCOUNTER — Encounter (INDEPENDENT_AMBULATORY_CARE_PROVIDER_SITE_OTHER): Payer: Self-pay

## 2017-04-06 VITALS — BP 112/69 | HR 83 | Ht 62.0 in | Wt 146.0 lb

## 2017-04-06 DIAGNOSIS — R202 Paresthesia of skin: Secondary | ICD-10-CM

## 2017-04-06 DIAGNOSIS — R253 Fasciculation: Secondary | ICD-10-CM | POA: Diagnosis not present

## 2017-04-06 DIAGNOSIS — R200 Anesthesia of skin: Secondary | ICD-10-CM | POA: Diagnosis not present

## 2017-04-06 NOTE — Progress Notes (Signed)
[facility]    Provider:  Dr Lucia Gaskins Referring Provider: Lauro Regulus, MD. Dr. Sheran Luz Primary Care Physician:  Lauro Regulus, MD  CC:  Numbness and tingling in the feet and toes  HPI:  Tamara [name] is a 57 y.o. female here as a referral from Dr. Dareen Piano for numbness and tingling in the hands and feet.  Past medical history migraine, hyperlipidemia, tobacco abuse, chronic pain syndrome, cervical disc disorder and cervical pain, thoracic spine pain, degenerative lumbar disc disease, cervical postlaminectomy syndrome, osteoarthritis, chronic obstructive lung disease, GERD, osteoarthritis.  She is here for numbness and tingling in the hands and feet.  She is status post cervical fusion C4-C5, C5-C6, C6-C7.  She has a pain agreement with [facility].  She was started on Cymbalta for her ongoing pain.  Her feet burn on the bottom of the feet in the middle of the feet, feels like she is walking on a sock, not all the time but worse when home from work after walking all day, better when laying in bed, worse after a lot of walking.. Feet are flat. Better when laying down worse at the end of the day and not continuous. Both feet are symmetric. She has some ankle pain.  Her hands burn and tingle, she gets cramps and they pull and cramp. She wears braces at night they don;t help. She has numbness and tingling in the first 3 fingers, she has difficulty picking things up with both hands, symmetric. She has cramping along her bra line which is very tight.  She also has twitching on the left side of the face, starts with her eye, then the lower face, numbness on the left side of the face, ongoing for years and recently worsening, every day, lasts a few minutes.   Reviewed notes, labs and imaging from outside physicians, which showed:  Reviewed referring physician notes.  Patient was seen his [facility] orth[facility]ics for back pain.  They reported pain, spasms,  aching, throbbing, numbness, burning and foot pain.  Current treatments include activity modification and pain medications, Percocet and Flexeril, gabapentin, current pain level to be 5 out of 10.  She continues to work full-time.  She complains of intermittent numbness and tingling in her hands as well as the bottom of her feet.  Reviewed MRI of the cervical spine report which showed ACDF C through C74, no canal stenosis, otherwise some mild to moderate neural foraminal stenosis at C4-C5, C6-C7 moderate right neural foraminal stenosis less severe after surgery, Chiari I malformation redemonstrated no cervical cord syrinx.  MRI of the thoracic spine showed multilevel small posterior disc protrusions and small disc protrusion osteophyte complexes without mass-effect in the cord  Hgba1c: 5.8 per patient at work, she also had a test for arthritis (I don;t have those labs, she is following up with pcp)  Review of Systems: Patient complains of symptoms per HPI as well as the following symptoms: Weight gain, fatigue, cramps, aching muscles, trouble swallowing. Pertinent negatives and positives per HPI. All others negative.   Social History   Socioeconomic History  . Marital status: Single    Spouse name: Not on file  . Number of children: Not on file  . Years of education: Not on file  . Highest education level: Some college, no degree  Social Needs  . Financial resource strain: Not on file  . Food insecurity - worry: Not on file  . Food insecurity - inability: Not on file  . Transportation  needs - medical: Not on file  . Transportation needs - non-medical: Not on file  Occupational History  . Not on file  Tobacco Use  . Smoking status: Current Every Day Smoker    Packs/day: 0.50    Years: 36.00    Pack years: 18.00  . Smokeless tobacco: Never Used  Substance and Sexual Activity  . Alcohol use: No    Frequency: Never  . Drug use: No  . Sexual activity: Not on file  Other Topics  Concern  . Not on file  Social History Narrative   Right handed   Lives at home with buddy swaney    Family History  Problem Relation Age of Onset  . Cancer Mother   . Heart disease Mother   . Hypertension Mother   . Cancer Father   . Cancer Brother     Past Medical History:  Diagnosis Date  . Cervical pain   . Cervical post-laminectomy syndrome   . Chronic pain syndrome   . COPD (chronic obstructive pulmonary disease) (HCC)   . Degenerative lumbar disc   . Emphysema lung (HCC)   . GERD (gastroesophageal reflux disease)   . H/O ulcer disease   . High cholesterol   . Migraine   . Osteoarthritis   . Spinal stenosis   . Thoracic spine pain     Past Surgical History:  Procedure Laterality Date  . BREAST CYST EXCISION Bilateral yrs ago   3 areas on the right and 1 on left  . Hysterectomy Complete    . NECK SURGERY  06/2013   plating w/ screws     Current Outpatient Medications  Medication Sig Dispense Refill  . atorvastatin (LIPITOR) 40 MG tablet Take 40 mg by mouth daily.  1  . cyclobenzaprine (FLEXERIL) 10 MG tablet Take 10 mg by mouth. 1 tablet 1-2 times daily as needed  3  . esomeprazole (NEXIUM) 40 MG capsule Take 40 mg by mouth 2 (two) times daily.  3  . fluticasone (FLONASE) 50 MCG/ACT nasal spray Place 2 sprays into both nostrils daily.  5  . gabapentin (NEURONTIN) 300 MG capsule Take 2 capsules by mouth 3 (three) times daily.  0  . loratadine (CLARITIN) 10 MG tablet Take 10 mg by mouth daily.  6  . oxyCODONE-acetaminophen (PERCOCET/ROXICET) 5-325 MG tablet Take 1 tablet by mouth 3 (three) times daily as needed.  0  . SPIRIVA HANDIHALER 18 MCG inhalation capsule Place 1 capsule (18 mcg total) into inhaler and inhale once daily  11  . SUMAtriptan (IMITREX) 100 MG tablet Take 1 tablet by mouth once as needed for migraine. May take a second dose after 2 hours if needed.  11  . celecoxib (CELEBREX) 200 MG capsule Take 200 mg by mouth 2 (two) times daily as needed.   0  . DULoxetine (CYMBALTA) 30 MG capsule Take 30 mg by mouth daily.  5  . promethazine (PHENERGAN) 25 MG tablet Take 25 mg by mouth every 8 (eight) hours as needed. for nausea  5   No current facility-administered medications for this visit.     Allergies as of 04/06/2017 - Review Complete 04/06/2017  Allergen Reaction Noted  . Cefdinir  08/20/2013  . Codeine  12/03/2012    Vitals: BP 112/69 (BP Location: Right Arm, Patient Position: Sitting)   Pulse 83   Ht 5\' 2"  (1.575 m)   Wt 146 lb (66.2 kg)   BMI 26.70 kg/m  Last Weight:  Wt Readings from Last  1 Encounters:  04/06/17 146 lb (66.2 kg)   Last Height:   Ht Readings from Last 1 Encounters:  04/06/17 5\' 2"  (1.575 m)   Physical exam: Exam: Gen: NAD, conversant, well nourised, well groomed                     CV: RRR, no MRG. No Carotid Bruits. No peripheral edema, warm, nontender Eyes: Conjunctivae clear without exudates or hemorrhage  Neuro: Detailed Neurologic Exam  Speech:    Speech is normal; fluent and spontaneous with normal comprehension.  Cognition:    The patient is oriented to person, place, and time;     recent and remote memory intact;     language fluent;     normal attention, concentration,     fund of knowledge Cranial Nerves:    The pupils are equal, round, and reactive to light. Attempted fundoscopic exam could not visualize.. Visual fields are full to finger confrontation. Extraocular movements are intact. Trigeminal sensation is intact and the muscles of mastication are normal. The face is symmetric. The palate elevates in the midline. Hearing intact. Voice is normal. Shoulder shrug is normal. The tongue has normal motion without fasciculations.   Coordination:    Normal finger to nose and heel to shin. Normal rapid alternating movements.   Gait:    Normal native gait  Motor Observation:    No asymmetry, no atrophy, and no involuntary movements noted. Tone:    Normal muscle tone.     Posture:    Posture is normal. normal erect    Strength: Very poor effort, giveway throughout, no apparent focal weakness        Sensation: intact to LT     Reflex Exam:  DTR's:    Deep tendon reflexes in the upper and lower extremities are normal bilaterally.   Toes:    The toes are downgoing bilaterally.   Clonus:    Clonus is absent.       Assessment/Plan:  57 year old with multiple complaints, pain in the feet, tingling in the hands, muscle pain in the thoracic area right along her very tight bra line, left-sided facial twitching and numbness   Foot pain: Sounds more orthopaedic as opposed to neuropathic, will check labs today but recommend Podiatry  Hand tingling/weakness: EMG/NCS bilateral uppers for CTS evaluation  Thoracic muscle pain: The pain is right along her bra line which is excessively tight, MRi thoracic spine was negative, recommend fitting for appropriate bra and monitoring clinically  Left facial twitching and numbness: check for stroke given her risk factors, ongoing tobacco abuse  Orders Placed This Encounter  Procedures  . MR BRAIN WO CONTRAST  . TSH  . Methylmalonic acid, serum  . B12 and Folate Panel  . NCV with EMG(electromyography)    I had a long d/w patient about her symptoms, risk for stroke/TIAs and tobacco cessation, personally independently reviewed imaging studies and stroke evaluation results and answered questions. ASA 81mg  (if not contraindicated, speak with pcp due to hx of ulcers) for stroke prevention and maintain strict control of hypertension with blood pressure goal below 130/90, diabetes with hemoglobin A1c goal below 6.5% and lipids with LDL cholesterol goal below 70 mg/dL.   Cc: Dr. Ethelene Halramos and Karlyn Ageeanderson   Antonia Ahern, MD  [facility] [address] [place], [place] [address]  Phone [phone] Fax [phone]

## 2017-04-06 NOTE — Patient Instructions (Signed)
Foot pain: Podiatry also will check labs today for causes of neuropathy in the feet but sounds more structural pain  Thoracic back pain: Try changing bras, get fitted and see what happens, bra very tight and can cause muscle and nerve damage  Left facial twitching; MRI brain

## 2017-04-09 ENCOUNTER — Telehealth: Payer: Self-pay | Admitting: *Deleted

## 2017-04-09 LAB — B12 AND FOLATE PANEL
Folate: 4.2 ng/mL (ref >3.0)
Vitamin B-12: 281 pg/mL (ref 232–1245)

## 2017-04-09 LAB — METHYLMALONIC ACID, SERUM: Methylmalonic Acid: 252 nmol/L (ref 0–378)

## 2017-04-09 LAB — TSH: TSH: 3.39 u[IU]/mL (ref 0.450–4.500)

## 2017-04-09 NOTE — Telephone Encounter (Signed)
Called patient and LVM (ok per DPR) informing her that her labs are normal. Informed patient that a call back is not required but encouraged to call with any questions. Also included that the office closed but reopens tomorrow 8-12. Left office number on message.

## 2017-04-09 NOTE — Telephone Encounter (Signed)
-----   Message from Anson FretAntonia B Ahern, MD sent at 04/09/2017  2:04 PM EST ----- Labs normal

## 2017-04-20 ENCOUNTER — Ambulatory Visit
Admission: RE | Admit: 2017-04-20 | Discharge: 2017-04-20 | Disposition: A | Discharge status: Home / Self Care | Payer: Managed Care, Other (non HMO) | Source: Ambulatory Visit | Attending: Neurology | Admitting: Neurology

## 2017-04-20 DIAGNOSIS — R200 Anesthesia of skin: Secondary | ICD-10-CM | POA: Diagnosis not present

## 2017-04-20 DIAGNOSIS — R253 Fasciculation: Secondary | ICD-10-CM

## 2017-04-20 DIAGNOSIS — R202 Paresthesia of skin: Secondary | ICD-10-CM

## 2017-04-22 ENCOUNTER — Ambulatory Visit (INDEPENDENT_AMBULATORY_CARE_PROVIDER_SITE_OTHER): Payer: Managed Care, Other (non HMO) | Admitting: Neurology

## 2017-04-22 ENCOUNTER — Ambulatory Visit: Payer: Managed Care, Other (non HMO) | Admitting: Neurology

## 2017-04-22 DIAGNOSIS — Z0289 Encounter for other administrative examinations: Secondary | ICD-10-CM

## 2017-04-22 DIAGNOSIS — R9082 White matter disease, unspecified: Secondary | ICD-10-CM

## 2017-04-22 DIAGNOSIS — R200 Anesthesia of skin: Secondary | ICD-10-CM | POA: Diagnosis not present

## 2017-04-22 DIAGNOSIS — R253 Fasciculation: Secondary | ICD-10-CM

## 2017-04-22 DIAGNOSIS — G5601 Carpal tunnel syndrome, right upper limb: Secondary | ICD-10-CM | POA: Diagnosis not present

## 2017-04-22 DIAGNOSIS — R202 Paresthesia of skin: Secondary | ICD-10-CM

## 2017-04-22 NOTE — Progress Notes (Signed)
Full Name: Tamara [name] Gender: Female MRN #: [id number] Date of Birth: Nov 30, 2060    Visit Date: 04/22/17 09:53 Age: 57 Years 2 Months Old Examining Physician: Naomie DeanAntonia Ahern, MD  Referring Physician: Lauro RegulusAnderson, Marshall W, MD. Dr. Sheran Luzichard Ramos  History: Paresthesias in the hands.   Summary: EMG/NCS was performed on the bilateral upper extremities  The right median/ulnar (palm) comparison nerve showed prolonged distal peak latency (Median Palm, 2.3 ms, N<2.2) and abnormal peak latency difference (Median Palm-Ulnar Palm, 0.5 ms, N<0.4) with a relative median delay.   All remaining nerves were within normal limits (as detailed in the following tables). All muscles were within normal limits (as detailed in the following tables).    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  Cc: Lauro RegulusAnderson, Marshall W, MD. Dr. Sheran Luzichard Ramos  Naomie DeanAntonia Ahern M.D.  [facility] [address] [place], [place] [address] Tel: [phone] Fax: [phone]        [name]    Nerve / Sites Muscle Latency Ref. Amplitude Ref. Rel Amp Segments Distance Velocity Ref. Area    ms ms mV mV %  cm m/s m/s mVms  L Median - APB     Wrist APB 3.7 ?4.4 9.6 ?4.0 100 Wrist - APB 7   37.7     Upper arm APB 7.2  8.0  83.6 Upper arm - Wrist 19 54 ?49 31.1  R Median - APB     Wrist APB 3.8 ?4.4 7.0 ?4.0 100 Wrist - APB 7   29.6     Upper arm APB 7.5  6.9  99.1 Upper arm - Wrist 19 51 ?49 28.9  L Ulnar - ADM     Wrist ADM 2.4 ?3.3 8.9 ?6.0 100 Wrist - ADM 7   32.6     B.Elbow ADM 5.7  8.4  94.3 B.Elbow - Wrist 18 55 ?49 31.7     A.Elbow ADM 7.6  7.8  92.8 A.Elbow - B.Elbow 10 55 ?49 30.7         A.Elbow - Wrist      R Ulnar - ADM     Wrist ADM 2.3 ?3.3 7.9 ?6.0 100 Wrist - ADM 7   25.3     B.Elbow ADM 5.2  6.5  81.9 B.Elbow - Wrist 17 59 ?49 22.6     A.Elbow ADM 7.3  6.5  99.8 A.Elbow - B.Elbow 12 56 ?49 22.1         A.Elbow - Wrist        SNC    Nerve / Sites Rec. Site Peak Lat Ref.  Amp Ref. Segments Distance Peak Diff Ref.    ms ms V V  cm ms ms  L Median, Ulnar - Transcarpal comparison     Median Palm Wrist 2.1 ?2.2 51 ?35 Median Palm - Wrist 8       Ulnar Palm Wrist 2.1 ?2.2 11 ?12 Ulnar Palm - Wrist 8          Median Palm - Ulnar Palm  -0.1 ?0.4  R Median, Ulnar - Transcarpal comparison     Median Palm Wrist 2.3 ?2.2 59 ?35 Median Palm - Wrist 8       Ulnar Palm Wrist 1.8 ?2.2 10 ?12 Ulnar Palm - Wrist 8          Median Palm - Ulnar Palm  0.5 ?0.4  L Median - Orthodromic (Dig II, Mid palm)  Dig II Wrist 3.1 ?3.4 13 ?10 Dig II - Wrist 13    R Median - Orthodromic (Dig II, Mid palm)     Dig II Wrist 3.5 ?3.4 15 ?10 Dig II - Wrist 13    L Ulnar - Orthodromic, (Dig V, Mid palm)     Dig V Wrist 2.9 ?3.1 12 ?5 Dig V - Wrist 11    R Ulnar - Orthodromic, (Dig V, Mid palm)     Dig V Wrist 2.8 ?3.1 7 ?5 Dig V - Wrist 23                   F  Wave    Nerve F Lat Ref.   ms ms  L Ulnar - ADM 25.9 ?32.0  R Ulnar - ADM 25.8 ?32.0         EMG full       EMG Summary Table    Spontaneous MUAP Recruitment  Muscle IA Fib PSW Fasc Other Amp Dur. Poly Pattern  L. Deltoid Normal None None None _______ Normal Normal Normal Normal  R. Deltoid Normal None None None _______ Normal Normal Normal Normal  L. Triceps brachii Normal None None None _______ Normal Normal Normal Normal  R. Triceps brachii Normal None None None _______ Normal Normal Normal Normal  L. Pronator teres Normal None None None _______ Normal Normal Normal Normal  R. Pronator teres Normal None None None _______ Normal Normal Normal Normal  L. First dorsal interosseous Normal None None None _______ Normal Normal Normal Normal  R. First dorsal interosseous Normal None None None _______ Normal Normal Normal Normal  L. Opponens pollicis Normal None None None _______ Normal Normal Normal Normal  R. Opponens pollicis Normal None None None _______ Normal Normal  Normal Normal

## 2017-04-22 NOTE — Patient Instructions (Signed)
Recommend calcium 600mg  with 600 vitamin D - take one tab twice daily  Hypocalcemia, Adult Hypocalcemia is when the level of calcium in a person's blood is below normal. Calcium is a mineral that is used by the body in many ways. A lack of blood calcium can affect the heart and muscles, make the bones more likely to break, and cause other problems. What are the causes? This condition may be caused by:  Decreased production (hypoparathyroidism) or improper use of parathyroid hormone.  Problems with the parathyroid glands or surgical removal of these glands.  Problems with parathyroid function after removal of the thyroid gland.  Lack (deficiency) of vitamin D or magnesium or both.  Kidney problems.  Less common causes include:  Intestinal problems that interfere with nutrient absorption.  Alcoholism.  Low levels of a body protein that is called albumin.  Inflammation of the pancreas (pancreatitis).  Certain medicines.  Severe infections (sepsis).  Certain diseases, such as sarcoidosis or hemochromatosis, that cause the parathyroid glands to be filled with cells or substances that are not normally present.  Breakdown of large amounts of muscle fiber.  High levels of phosphate in the body.  Cancer.  Massive blood transfusions, which usually occur with severe trauma.  What are the signs or symptoms? Symptoms of this condition include:  Numbness and tingling in the fingers, toes, or around the mouth.  Muscle aches or cramps, especially in the legs, feet, and back.  Muscle twitches.  Abdominal cramping or pain.  Memory problems, confusion, or difficulty thinking.  Depression, anxiety, irritability, or changes in personality.  Fainting.  Chest pain.  Difficulty swallowing.  Changes in the sound of the voice.  Shortness of breath or wheezing.  General weakness and fatigue.  Symptoms of severe hypocalcemia include:  Shaking uncontrollably  (seizures).  Seizure of the voice box (laryngospasm).  Fast heartbeats (palpitations) and abnormal heart rhythms (arrhythmias).  Long-term symptoms of this condition include:  Coarse, brittle hair and nails.  Dry skin or lasting (chronic) skin diseases (psoriasis, eczema,, or dermatitis).  Clouding of the eye lens (cataracts).  How is this diagnosed? This condition is usually diagnosed with a blood test. You may also have other tests to help determine the underlying cause of the condition. For example, a test may be done that records the electrical activity of the heart (electrocardiogram,or ECG). How is this treated? Treatment for this condition may include:  Calcium given by mouth (orally) or given through an IV tube that is inserted into one of your veins. The method used for giving calcium will depend on the severity of the condition.  Other minerals (electrolytes), such as magnesium.  Other treatment will depend on the cause of the condition. Follow these instructions at home:  Follow diet instructions from your health care provider or dietitian.  Take supplements only as told by your health care provider.  Keep all follow-up visits as told by your health care provider. This is important. Contact a health care provider if:  You have increased fatigue.  You have increased muscle twitching.  You have new swelling in the feet, ankles, or legs.  You develop changes in mood, memory, or personality. Get help right away if:  You have chest pain.  You have persistent rapid or irregular heartbeats.  You have difficulty breathing.  You faint.  You start to have seizures.  You have confusion. This information is not intended to replace advice given to you by your health care provider. Make sure you  discuss any questions you have with your health care provider. Document Released: 08/14/2009 Document Revised: 08/02/2015 Document Reviewed: 07/12/2014 Elsevier Interactive  Patient Education  [employer]2018 [employer]

## 2017-04-23 NOTE — Procedures (Signed)
Full Name: Tamara [name] Gender: Female MRN #: [id number] Date of Birth: 05/17/2060    Visit Date: 04/22/17 09:53 Age: 57 Years 2 Months Old Examining Physician: Naomie Dean, MD  Referring Physician: Lauro Regulus, MD. Dr. Sheran Luz  History: Paresthesias in the hands.   Summary: EMG/NCS was performed on the upper extremities  The right median/ulnar (palm) comparison nerve showed prolonged distal peak latency (Median Palm, 2.3 ms, N<2.2) and abnormal peak latency difference (Median Palm-Ulnar Palm, 0.5 ms, N<0.4) with a relative median delay.   All remaining nerves were within normal limits (as detailed in the following tables). All muscles were within normal limits (as detailed in the following tables).    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  Cc: Lauro Regulus, MD. Dr. Sheran Luz  Naomie Dean M.D.  [facility] [address] [place], [place] [address] Tel: [phone] Fax: [phone]        [facility]    Nerve / Sites Muscle Latency Ref. Amplitude Ref. Rel Amp Segments Distance Velocity Ref. Area    ms ms mV mV %  cm m/s m/s mVms  L Median - APB     Wrist APB 3.7 ?4.4 9.6 ?4.0 100 Wrist - APB 7   37.7     Upper arm APB 7.2  8.0  83.6 Upper arm - Wrist 19 54 ?49 31.1  R Median - APB     Wrist APB 3.8 ?4.4 7.0 ?4.0 100 Wrist - APB 7   29.6     Upper arm APB 7.5  6.9  99.1 Upper arm - Wrist 19 51 ?49 28.9  L Ulnar - ADM     Wrist ADM 2.4 ?3.3 8.9 ?6.0 100 Wrist - ADM 7   32.6     B.Elbow ADM 5.7  8.4  94.3 B.Elbow - Wrist 18 55 ?49 31.7     A.Elbow ADM 7.6  7.8  92.8 A.Elbow - B.Elbow 10 55 ?49 30.7         A.Elbow - Wrist      R Ulnar - ADM     Wrist ADM 2.3 ?3.3 7.9 ?6.0 100 Wrist - ADM 7   25.3     B.Elbow ADM 5.2  6.5  81.9 B.Elbow - Wrist 17 59 ?49 22.6     A.Elbow ADM 7.3  6.5  99.8 A.Elbow - B.Elbow 12 56 ?49 22.1         A.Elbow - Wrist                 SNC      Nerve / Sites Rec. Site Peak Lat Ref.  Amp Ref. Segments Distance Peak Diff Ref.    ms ms V V  cm ms ms  L Median, Ulnar - Transcarpal comparison     Median Palm Wrist 2.1 ?2.2 51 ?35 Median Palm - Wrist 8       Ulnar Palm Wrist 2.1 ?2.2 11 ?12 Ulnar Palm - Wrist 8          Median Palm - Ulnar Palm  -0.1 ?0.4  R Median, Ulnar - Transcarpal comparison     Median Palm Wrist 2.3 ?2.2 59 ?35 Median Palm - Wrist 8       Ulnar Palm Wrist 1.8 ?2.2 10 ?12 Ulnar Palm - Wrist 8          Median Palm - Ulnar Palm  0.5 ?0.4  L Median - Orthodromic (Dig II, Mid palm)     Dig II Wrist 3.1 ?3.4 13 ?10 Dig II - Wrist 13    R Median - Orthodromic (Dig II, Mid palm)     Dig II Wrist 3.5 ?3.4 15 ?10 Dig II - Wrist 13    L Ulnar - Orthodromic, (Dig V, Mid palm)     Dig V Wrist 2.9 ?3.1 12 ?5 Dig V - Wrist 11    R Ulnar - Orthodromic, (Dig V, Mid palm)     Dig V Wrist 2.8 ?3.1 7 ?5 Dig V - Wrist 7711                   F  Wave    Nerve F Lat Ref.   ms ms  L Ulnar - ADM 25.9 ?32.0  R Ulnar - ADM 25.8 ?32.0         EMG full       EMG Summary Table    Spontaneous MUAP Recruitment  Muscle IA Fib PSW Fasc Other Amp Dur. Poly Pattern  L. Deltoid Normal None None None _______ Normal Normal Normal Normal  R. Deltoid Normal None None None _______ Normal Normal Normal Normal  L. Triceps brachii Normal None None None _______ Normal Normal Normal Normal  R. Triceps brachii Normal None None None _______ Normal Normal Normal Normal  L. Pronator teres Normal None None None _______ Normal Normal Normal Normal  R. Pronator teres Normal None None None _______ Normal Normal Normal Normal  L. First dorsal interosseous Normal None None None _______ Normal Normal Normal Normal  R. First dorsal interosseous Normal None None None _______ Normal Normal Normal Normal  L. Opponens pollicis Normal None None None _______ Normal Normal Normal Normal  R. Opponens pollicis Normal None None None _______ Normal Normal Normal  Normal

## 2017-04-23 NOTE — Progress Notes (Signed)
Reviewed MRI of the brain with patient.  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  His changes are more advanced than for stated age likely due to patient's vascular risk factors most importantly continued smoking.  Discussed this with patient, discussed that this is vascular damage in the brain and highly encouraged her to quit smoking.  Reviewed images with patient.  Answered questions.  A total of 15 minutes was spent in with this patient face-to-face. Over half this time was spent on counseling patient on the white matter changes diagnosis and different therapeutic options available. This does not include time spent on emg/ncs.       FINDINGS:   No abnormal lesions are seen on diffusion-weighted views to suggest acute ischemia. The cortical sulci, fissures and cisterns are normal in size and appearance. Lateral, third and fourth ventricle are normal in size and appearance. No extra-axial fluid collections are seen. No evidence of mass effect or midline shift.  Multiple (> 13) scattered round subcortical and juxtacortical foci of non-specific T2 hyperintensities.  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 No evidence of intracranial hemorrhage on SWI views. The orbits and their contents, paranasal sinuses and calvarium are unremarkable.  Intracranial flow voids are present.   IMPRESSION:   MRI brain (without) demonstrating: - Multiple (> 13) scattered round subcortical and juxtacortical foci of non-specific T2 hyperintensities. These findings are non-specific and considerations include autoimmune, inflammatory, post-infectious, microvascular ischemic or migraine associated etiologies.  - No acute findings.

## 2017-04-23 NOTE — Progress Notes (Signed)
See procedure note.

## 2017-04-28 ENCOUNTER — Telehealth: Payer: Self-pay | Admitting: Neurology

## 2017-04-28 NOTE — Telephone Encounter (Signed)
Called patient to let her know the medical records she requested will be put in the mail today. Pt had concerns about getting it in the mail on time, I told her that she has the option to pick it up at our office. Pt stated she was unsure about getting here due to conflicting work hours.

## 2017-06-10 ENCOUNTER — Telehealth: Payer: Self-pay | Admitting: *Deleted

## 2017-06-10 DIAGNOSIS — Z122 Encounter for screening for malignant neoplasm of respiratory organs: Secondary | ICD-10-CM

## 2017-06-10 DIAGNOSIS — Z87891 Personal history of nicotine dependence: Secondary | ICD-10-CM

## 2017-06-10 NOTE — Telephone Encounter (Signed)
Notified patient that annual lung cancer screening low dose CT scan is due currently or will be in near future. Confirmed that patient is within the age range of 55-77, and asymptomatic, (no signs or symptoms of lung cancer). Patient denies illness that would prevent curative treatment for lung cancer if found. Verified smoking history, (current, 36.5 pack year). The shared decision making visit was done 06/18/16. Patient is agreeable for CT scan being scheduled.

## 2017-06-18 ENCOUNTER — Ambulatory Visit
Admission: RE | Admit: 2017-06-18 | Discharge: 2017-06-18 | Disposition: A | Discharge status: Home / Self Care | Payer: Managed Care, Other (non HMO) | Source: Ambulatory Visit | Attending: Nurse Practitioner | Admitting: Nurse Practitioner

## 2017-06-18 DIAGNOSIS — J432 Centrilobular emphysema: Secondary | ICD-10-CM | POA: Diagnosis not present

## 2017-06-18 DIAGNOSIS — Z122 Encounter for screening for malignant neoplasm of respiratory organs: Secondary | ICD-10-CM | POA: Insufficient documentation

## 2017-06-18 DIAGNOSIS — Z87891 Personal history of nicotine dependence: Secondary | ICD-10-CM | POA: Insufficient documentation

## 2017-06-18 DIAGNOSIS — I700 Atherosclerosis of aorta: Secondary | ICD-10-CM | POA: Diagnosis not present

## 2017-06-22 ENCOUNTER — Encounter: Payer: Self-pay | Admitting: *Deleted

## 2017-11-16 ENCOUNTER — Other Ambulatory Visit: Payer: Self-pay | Admitting: Internal Medicine

## 2017-11-16 DIAGNOSIS — Z1231 Encounter for screening mammogram for malignant neoplasm of breast: Secondary | ICD-10-CM

## 2017-12-08 ENCOUNTER — Inpatient Hospital Stay: Admission: RE | Admit: 2017-12-08 | Payer: Managed Care, Other (non HMO) | Source: Ambulatory Visit

## 2017-12-22 ENCOUNTER — Ambulatory Visit
Admission: RE | Admit: 2017-12-22 | Discharge: 2017-12-22 | Disposition: A | Discharge status: Home / Self Care | Payer: Managed Care, Other (non HMO) | Source: Ambulatory Visit | Attending: Internal Medicine | Admitting: Internal Medicine

## 2017-12-22 DIAGNOSIS — Z1231 Encounter for screening mammogram for malignant neoplasm of breast: Secondary | ICD-10-CM | POA: Insufficient documentation

## 2018-05-27 ENCOUNTER — Encounter: Payer: Self-pay | Admitting: *Deleted

## 2018-05-30 ENCOUNTER — Encounter: Payer: Self-pay | Admitting: *Deleted

## 2018-08-05 ENCOUNTER — Telehealth: Payer: Self-pay | Admitting: *Deleted

## 2018-08-05 DIAGNOSIS — Z87891 Personal history of nicotine dependence: Secondary | ICD-10-CM

## 2018-08-05 DIAGNOSIS — Z122 Encounter for screening for malignant neoplasm of respiratory organs: Secondary | ICD-10-CM

## 2018-08-05 NOTE — Telephone Encounter (Signed)
Patient has been notified that annual lung cancer screening low dose CT scan is due currently or will be in near future. Confirmed that patient is within the age range of 55-77, and asymptomatic, (no signs or symptoms of lung cancer). Patient denies illness that would prevent curative treatment for lung cancer if found. Verified smoking history, (current, 37 pack year). The shared decision making visit was done 06/18/16. Patient is agreeable for CT scan being scheduled.

## 2018-08-11 ENCOUNTER — Ambulatory Visit
Admission: RE | Admit: 2018-08-11 | Discharge: 2018-08-11 | Disposition: A | Discharge status: Home / Self Care | Payer: Managed Care, Other (non HMO) | Source: Ambulatory Visit | Attending: Oncology | Admitting: Oncology

## 2018-08-11 ENCOUNTER — Other Ambulatory Visit: Payer: Self-pay

## 2018-08-11 DIAGNOSIS — Z122 Encounter for screening for malignant neoplasm of respiratory organs: Secondary | ICD-10-CM | POA: Diagnosis present

## 2018-08-11 DIAGNOSIS — Z87891 Personal history of nicotine dependence: Secondary | ICD-10-CM | POA: Diagnosis present

## 2018-08-13 ENCOUNTER — Encounter: Payer: Self-pay | Admitting: *Deleted

## 2018-09-24 ENCOUNTER — Telehealth: Payer: Self-pay | Admitting: Gastroenterology

## 2018-09-24 NOTE — Telephone Encounter (Signed)
Hi Dr. Silverio Decamp, we have received a referral from patient's PCP for dysphagia. Patient has been seen at [facility] last December. Records are in Epic for your review. Please advise if it is ok to schedule patient with you. Thank you.

## 2018-10-04 NOTE — Telephone Encounter (Signed)
She had extensive work up at [facility] and the GI doctors there are trying to work up her symptoms, she should continue to follow up there. Thanks

## 2018-10-04 NOTE — Telephone Encounter (Signed)
Left message to call back  

## 2018-10-12 ENCOUNTER — Other Ambulatory Visit: Payer: Self-pay | Admitting: Student

## 2018-10-12 DIAGNOSIS — R1312 Dysphagia, oropharyngeal phase: Secondary | ICD-10-CM

## 2018-11-24 ENCOUNTER — Ambulatory Visit: Payer: Managed Care, Other (non HMO)

## 2019-03-14 HISTORY — PX: CATARACT EXTRACTION: SUR2

## 2019-03-21 HISTORY — PX: CATARACT EXTRACTION: SUR2

## 2019-08-03 ENCOUNTER — Telehealth: Payer: Self-pay

## 2019-08-03 DIAGNOSIS — Z87891 Personal history of nicotine dependence: Secondary | ICD-10-CM

## 2019-08-03 DIAGNOSIS — Z122 Encounter for screening for malignant neoplasm of respiratory organs: Secondary | ICD-10-CM

## 2019-08-03 NOTE — Telephone Encounter (Signed)
Patient has been notified that the low dose lung cancer screening CT scan is due currently or will be in near future.  Confirmed that patient is within the appropriate age range and asymptomatic, (no signs or symptoms of lung cancer).  Patient denies illness that would prevent curative treatment for lung cancer if found.  Patient is agreeable for CT scan being scheduled.    Verified smoking history (current smoker, 37 year 0.5 ppd history).   CT scheduled for 08/17/19 @ 3:30.

## 2019-08-05 NOTE — Telephone Encounter (Signed)
Smoking history: current, 37.5 pack year °

## 2019-08-05 NOTE — Addendum Note (Signed)
Addended by: Jonne Ply on: 08/05/2019 05:03 PM   Modules accepted: Orders

## 2019-08-17 ENCOUNTER — Other Ambulatory Visit: Payer: Self-pay

## 2019-08-17 ENCOUNTER — Ambulatory Visit
Admission: RE | Admit: 2019-08-17 | Discharge: 2019-08-17 | Disposition: A | Discharge status: Home / Self Care | Payer: Managed Care, Other (non HMO) | Source: Ambulatory Visit | Attending: Oncology | Admitting: Oncology

## 2019-08-17 DIAGNOSIS — Z87891 Personal history of nicotine dependence: Secondary | ICD-10-CM | POA: Insufficient documentation

## 2019-08-17 DIAGNOSIS — Z122 Encounter for screening for malignant neoplasm of respiratory organs: Secondary | ICD-10-CM | POA: Diagnosis present

## 2019-08-22 ENCOUNTER — Encounter: Payer: Self-pay | Admitting: *Deleted

## 2019-11-28 ENCOUNTER — Other Ambulatory Visit: Payer: Self-pay | Admitting: Internal Medicine

## 2019-11-28 DIAGNOSIS — Z1231 Encounter for screening mammogram for malignant neoplasm of breast: Secondary | ICD-10-CM

## 2019-12-14 ENCOUNTER — Ambulatory Visit
Admission: RE | Admit: 2019-12-14 | Discharge: 2019-12-14 | Disposition: A | Discharge status: Home / Self Care | Payer: Managed Care, Other (non HMO) | Source: Ambulatory Visit | Attending: Internal Medicine | Admitting: Internal Medicine

## 2019-12-14 ENCOUNTER — Other Ambulatory Visit: Payer: Self-pay

## 2019-12-14 DIAGNOSIS — Z1231 Encounter for screening mammogram for malignant neoplasm of breast: Secondary | ICD-10-CM | POA: Diagnosis not present

## 2019-12-29 ENCOUNTER — Encounter: Payer: Self-pay | Admitting: Neurology

## 2019-12-29 ENCOUNTER — Other Ambulatory Visit: Payer: Self-pay

## 2019-12-30 ENCOUNTER — Encounter (INDEPENDENT_AMBULATORY_CARE_PROVIDER_SITE_OTHER): Payer: Managed Care, Other (non HMO) | Admitting: Vascular Surgery

## 2020-01-02 ENCOUNTER — Encounter: Payer: Self-pay | Admitting: Neurology

## 2020-01-02 ENCOUNTER — Ambulatory Visit: Payer: Managed Care, Other (non HMO) | Admitting: Neurology

## 2020-01-02 ENCOUNTER — Other Ambulatory Visit: Payer: Self-pay

## 2020-01-02 VITALS — BP 109/68 | HR 93 | Ht 62.0 in | Wt 142.0 lb

## 2020-01-02 DIAGNOSIS — M79672 Pain in left foot: Secondary | ICD-10-CM

## 2020-01-02 DIAGNOSIS — M79642 Pain in left hand: Secondary | ICD-10-CM

## 2020-01-02 DIAGNOSIS — M79671 Pain in right foot: Secondary | ICD-10-CM | POA: Diagnosis not present

## 2020-01-02 DIAGNOSIS — M79641 Pain in right hand: Secondary | ICD-10-CM

## 2020-01-02 NOTE — Patient Instructions (Signed)
Blood work EMG/NCS   Plantar Fasciitis  Plantar fasciitis is a painful foot condition that affects the heel and bottom of foot. It occurs when the band of tissue that connects the toes to the heel bone (plantar fascia) becomes irritated. This can happen as the result of exercising too much or doing other repetitive activities (overuse injury). The pain from plantar fasciitis can range from mild irritation to severe pain that makes it difficult to walk or move. The pain is usually worse in the morning after sleeping, or after sitting or lying down for a while. Pain may also be worse after long periods of walking or standing. What are the causes? This condition may be caused by:  Standing for long periods of time.  Wearing shoes that do not have good arch support.  Doing activities that put stress on joints (high-impact activities), including running, aerobics, and ballet.  Being overweight.  An abnormal way of walking (gait).  Tight muscles in the back of your lower leg (calf).  High arches in your feet.  Starting a new athletic activity. What are the signs or symptoms? The main symptom of this condition is heel pain. Pain may:  Be worse with first steps after a time of rest, especially in the morning after sleeping or after you have been sitting or lying down for a while.  Be worse after long periods of standing still.  Decrease after 30-45 minutes of activity, such as gentle walking. How is this diagnosed? This condition may be diagnosed based on your medical history and your symptoms. Your health care provider may ask questions about your activity level. Your health care provider will do a physical exam to check for:  A tender area on the bottom of your foot. Burning.   A high arch in your foot.  Pain when you move your foot.  Difficulty moving your foot. You may have imaging tests to confirm the diagnosis, such as:  X-rays.  Ultrasound.  MRI. How is this  treated? Treatment for plantar fasciitis depends on how severe your condition is. Treatment may include:  Rest, ice, applying pressure (compression), and raising the affected foot (elevation). This may be called RICE therapy. Your health care provider may recommend RICE therapy along with over-the-counter pain medicines to manage your pain.  Exercises to stretch your calves and your plantar fascia.  A splint that holds your foot in a stretched, upward position while you sleep (night splint).  Physical therapy to relieve symptoms and prevent problems in the future.  Injections of steroid medicine (cortisone) to relieve pain and inflammation.  Stimulating your plantar fascia with electrical impulses (extracorporeal shock wave therapy). This is usually the last treatment option before surgery.  Surgery, if other treatments have not worked after 12 months. Follow these instructions at home:  Managing pain, stiffness, and swelling  If directed, put ice on the painful area: ? Put ice in a plastic bag, or use a frozen bottle of water. ? Place a towel between your skin and the bag or bottle. ? Roll the bottom of your foot over the bag or bottle. ? Do this for 20 minutes, 2-3 times a day.  Wear athletic shoes that have air-sole or gel-sole cushions, or try wearing soft shoe inserts that are designed for plantar fasciitis.  Raise (elevate) your foot above the level of your heart while you are sitting or lying down. Activity  Avoid activities that cause pain. Ask your health care provider what activities are safe  for you.  Do physical therapy exercises and stretches as told by your health care provider.  Try activities and forms of exercise that are easier on your joints (low-impact). Examples include swimming, water aerobics, and biking. General instructions  Take over-the-counter and prescription medicines only as told by your health care provider.  Wear a night splint while sleeping,  if told by your health care provider. Loosen the splint if your toes tingle, become numb, or turn cold and blue.  Maintain a healthy weight, or work with your health care provider to lose weight as needed.  Keep all follow-up visits as told by your health care provider. This is important. Contact a health care provider if you:  Have symptoms that do not go away after caring for yourself at home.  Have pain that gets worse.  Have pain that affects your ability to move or do your daily activities. Summary  Plantar fasciitis is a painful foot condition that affects the heel. It occurs when the band of tissue that connects the toes to the heel bone (plantar fascia) becomes irritated.  The main symptom of this condition is heel pain that may be worse after exercising too much or standing still for a long time.  Treatment varies, but it usually starts with rest, ice, compression, and elevation (RICE therapy) and over-the-counter medicines to manage pain. This information is not intended to replace advice given to you by your health care provider. Make sure you discuss any questions you have with your health care provider. Document Revised: 02/06/2017 Document Reviewed: 12/22/2016 Elsevier Patient Education  2020 [employer].

## 2020-01-02 NOTE — Progress Notes (Addendum)
[facility]    Provider:  Dr Jaynee Eagles Requesting Provider: Kirk Ruths, MD Primary Care Provider:  Kirk Ruths, MD  CC:  Hand and foot pain  HPI:  Tamara [name] is a 59 y.o. female here as requested by Kirk Ruths, MD for paresthesias.  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  She is here for new symptoms paresthesias in the hand, she states that these are new and different from what she was seen for a few years ago.  I did review notes from several years ago where she discussed feet burning on the bottom, feeling like she is wearing a sock, worse after a lot of walking, flat feet, better when laying down.  Patient has been evaluated by [place] for back pain and she is treated for chronic pain with multiple medications including in the past Percocet Flexeril and gabapentin as well as Cymbalta.  Foot pain in the past sounded more orthopedic as opposed to an neuropathic but extensive lab tests were checked.  EMG nerve conduction study was completed.  As far as her thoracic muscle pain, it was right along her bra line which was excessively tight and MRI of the thoracic spine was negative, we recommended fitting for appropriate bra wear.  We also performed an MRI of her brain in the past.  B12 in the past was 281, which was low normal however methylmalonic acid was normal, TSH was normal as well.  EMG nerve conduction study of the upper extremities showed mild right carpal tunnel syndrome.  Today she is here for different symptoms, she states her fingers cramp, burning in both palms, different than her symptoms that she had in early 2019, she is also had swelling in the hands as the day goes on. Her hands burn around the thumb area and palm. She ahs  swelling in the hands around the joints. She has sore joints. It hurts to tote anything like her bag. She still sees Dr. Nelva Bush. She had cervical surgery in the past. She has joint pain in the feet. No numbness or tingling. She has problems with fine motor in the hands. Also buttoning things. No numbness or tingling. The symptoms are all day, they get worse as the day goes on. She has a lot of cramps in the feet and in the hands. She has tried drinking water, zinc, magnesium, pain int he hands and feet but not radicular symptoms or associated with neck or back pain. She does see Dr. Nelva Bush for her low back and neck pain, she sees him every 3-4 months. Both hands and feet are symmetrical. The feet are stable but the hand have new symptoms. No other focal neurologic deficits, associated symptoms, inciting events or modifiable factors.  Reviewed notes, labs and imaging from outside physicians, which showed;   MRI brain (without) demonstrating: 04/2017. Personally reviewed imaging and agree. - Multiple (> 13) scattered round subcortical and juxtacortical foci of non-specific T2 hyperintensities. These findings are non-specific and considerations include autoimmune, inflammatory, post-infectious, microvascular ischemic or migraine associated etiologies.  - No acute findings.  Review of Systems: Patient complains of symptoms per HPI as well as the following symptoms: hand and foot pain, joint pain. Pertinent negatives and positives per HPI. All others negative.   Social History   Socioeconomic History  . Marital status: Single  Spouse name: Not on file  . Number of children: 2  . Years of education: Not on file  . Highest education level: Some college, no degree  Occupational History  . Not on file  Tobacco Use  . Smoking status: Current Every Day Smoker    Packs/day: 0.50    Years: 36.00    Pack years: 18.00  . Smokeless tobacco: Never Used  Vaping Use  . Vaping Use: Never used  Substance  and Sexual Activity  . Alcohol use: No  . Drug use: No  . Sexual activity: Not on file  Other Topics Concern  . Not on file  Social History Narrative   Right handed   Lives at home with buddy swaney   Social Determinants of Health   Financial Resource Strain:   . Difficulty of Paying Living Expenses: Not on file  Food Insecurity:   . Worried About Charity fundraiser in the Last Year: Not on file  . Ran Out of Food in the Last Year: Not on file  Transportation Needs:   . Lack of Transportation (Medical): Not on file  . Lack of Transportation (Non-Medical): Not on file  Physical Activity:   . Days of Exercise per Week: Not on file  . Minutes of Exercise per Session: Not on file  Stress:   . Feeling of Stress : Not on file  Social Connections:   . Frequency of Communication with Friends and Family: Not on file  . Frequency of Social Gatherings with Friends and Family: Not on file  . Attends Religious Services: Not on file  . Active Member of Clubs or Organizations: Not on file  . Attends Archivist Meetings: Not on file  . Marital Status: Not on file  Intimate Partner Violence:   . Fear of Current or Ex-Partner: Not on file  . Emotionally Abused: Not on file  . Physically Abused: Not on file  . Sexually Abused: Not on file    Family History  Problem Relation Age of Onset  . Cancer Mother        multiple myeloma  . Heart disease Mother   . Hypertension Mother   . Aneurysm Mother        thoracic  . Cancer Father        lung cancer  . Cancer Brother   . Breast cancer Neg Hx   . Neuropathy Neg Hx     Past Medical History:  Diagnosis Date  . Anesthesia complication    slow to awaken with past procedures, also caused nausea  . Cervical pain   . Cervical post-laminectomy syndrome   . Chronic pain syndrome   . COPD (chronic obstructive pulmonary disease) ([place])   . Degenerative lumbar disc   . Emphysema lung ([place])   . GERD (gastroesophageal reflux  disease)   . H/O ulcer disease   . High cholesterol   . Migraine   . Osteoarthritis   . Spinal stenosis   . Thoracic spine pain     Patient Active Problem List   Diagnosis Date Noted  . Personal history of tobacco use, presenting hazards to health 06/18/2016  . HYPERLIPIDEMIA 01/27/2007  . DEPRESSION 01/27/2007  . COPD 01/27/2007  . ESOPHAGEAL SPASM 01/27/2007  . GERD 01/27/2007  . PEPTIC ULCER DISEASE 01/27/2007  . IBS 01/27/2007  . ENDOMETRIOSIS, SITE UNSPECIFIED 01/27/2007  . FATIGUE 01/27/2007  . MURMUR 01/27/2007  . COLONIC POLYPS, HX OF 01/27/2007    Past Surgical History:  Procedure Laterality Date  . APPENDECTOMY    . BREAST CYST EXCISION Bilateral yrs ago   3 areas on the right and 1 on left  . CATARACT EXTRACTION Right 03/14/2019  . CATARACT EXTRACTION Left 03/21/2019  . Hysterectomy Complete    . NECK SURGERY  06/2013   plating w/ screws   . TONSILLECTOMY      Current Outpatient Medications  Medication Sig Dispense Refill  . atorvastatin (LIPITOR) 40 MG tablet Take 40 mg by mouth daily.  1  . Budeson-Glycopyrrol-Formoterol (BREZTRI AEROSPHERE) 160-9-4.8 MCG/ACT AERO Take 2 puffs by mouth 2 (two) times daily.    . celecoxib (CELEBREX) 200 MG capsule Take 200 mg by mouth 2 (two) times daily as needed.  0  . clonazePAM (KLONOPIN) 0.5 MG tablet Take 0.5 mg by mouth 2 (two) times daily as needed.    . cyclobenzaprine (FLEXERIL) 10 MG tablet Take 10 mg by mouth. 1 tablet 1-2 times daily as needed  3  . esomeprazole (NEXIUM) 40 MG capsule Take 40 mg by mouth 2 (two) times daily.  3  . fluticasone (FLONASE) 50 MCG/ACT nasal spray Place 2 sprays into both nostrils daily.  5  . gabapentin (NEURONTIN) 300 MG capsule Take 2 capsules by mouth 3 (three) times daily.  0  . loratadine (CLARITIN) 10 MG tablet Take 10 mg by mouth daily.  6  . lubiprostone (AMITIZA) 8 MCG capsule lubiprostone 8 mcg capsule  Take 1 capsule (8 mcg total) by mouth 2 (two) times daily with meals  for 30 days    . oxyCODONE-acetaminophen (PERCOCET/ROXICET) 5-325 MG tablet Take 1 tablet by mouth 3 (three) times daily as needed.  0  . OXYGEN Inhale 2 L into the lungs. While at home    . SUMAtriptan (IMITREX) 100 MG tablet Take 1 tablet by mouth once as needed for migraine. May take a second dose after 2 hours if needed.  11  . traZODone (DESYREL) 50 MG tablet Take 1 tablet by mouth at bedtime as needed.      No current facility-administered medications for this visit.    Allergies as of 01/02/2020 - Review Complete 01/02/2020  Allergen Reaction Noted  . Cefdinir  08/20/2013  . Codeine  12/03/2012  . Hydrocodone-acetaminophen Other (See Comments) 02/18/2018  . Sulfa antibiotics Other (See Comments) 02/18/2018    Vitals: BP 109/68 (BP Location: Right Arm, Patient Position: Sitting)   Pulse 93   Ht 5' 2"  (1.575 m)   Wt 142 lb (64.4 kg)   BMI 25.97 kg/m  Last Weight:  Wt Readings from Last 1 Encounters:  01/02/20 142 lb (64.4 kg)   Last Height:   Ht Readings from Last 1 Encounters:  01/02/20 5' 2"  (1.575 m)     Physical exam: Exam: Gen: NAD, conversant, well nourised, well groomed                     CV: RRR, no MRG. No Carotid Bruits. No peripheral edema, warm, nontender Eyes: Conjunctivae clear without exudates or hemorrhage  Neuro: Detailed Neurologic Exam  Speech:    Speech is normal; fluent and spontaneous with normal comprehension.  Cognition:    The patient is oriented to person, place, and time;     recent and remote memory intact;     language fluent;     normal attention, concentration,     fund of knowledge Cranial Nerves:    The pupils are equal, round, and reactive to light. Attempted fundoscopy  could not visualize fundi.. Visual fields are full to finger confrontation. Extraocular movements are intact. Trigeminal sensation is intact and the muscles of mastication are normal. The face is symmetric. The palate elevates in the midline. Hearing intact.  Voice is normal. Shoulder shrug is normal. The tongue has normal motion without fasciculations.   Coordination:    Normal finger to nose and heel to shin. Normal rapid alternating movements.   Gait:    No ataxia, not shuffling  Motor Observation:    No asymmetry, no involuntary movements noted. Tone:    Normal muscle tone.    Posture:    Posture is normal. normal erect    Strength: poor effort, on focal exam       Sensation: intact to LT, pin prick, cold     Reflex Exam:  DTR's: AJs 1+. Deep tendon reflexes in the upper and lower extremities are symmetrical bilaterally.   Toes:    The toes are downgoing bilaterally.   Clonus:    Clonus is absent.    Assessment/Plan:  59 y.o. female here as requested by Kirk Ruths, MD for paresthesias.  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  She is here for new symptoms paresthesias in the hand, also continued pain in the feet. she states hand symptoms are new and different from what she was seen for a few years ago. Feet symptoms are stable.   Feet: Sounds like joint pain and plantar fasciitis as opposed to nerve disease.  I recommend podiatrist. Her feet are the worst with the first steps in the morning, better with rest.I discussed in the past with her, she has flat feet, not wearing any support in the shoes. Again, recommend podiatry.   Hands: Also sounds like joint pain. No paresthesias. Will repeat emg/ncs to see if there is progression of CTS but will also check extensive serum testing including autoimmune disorders.  EMG/NCS of the upper extremities including radial sensory. Sural and Peroneal superficial sensory in one leg.   Orders Placed This Encounter  Procedures  . B12 and Folate Panel  . Methylmalonic acid, serum  . Vitamin  B1  . Hemoglobin A1c  . TSH  . Sedimentation rate  . Sjogren's syndrome antibods(ssa + ssb)  . Rheumatoid factor  . Heavy metals, blood  . Vitamin B6  . Multiple Myeloma Panel (SPEP&IFE w/QIG)  . NCV with EMG(electromyography)   Cc: Kirk Ruths, MD,  Suella Broad, MD  Sarina Ill, MD  [facility] [address] [place], [place] [address]  Phone [phone] Fax [phone]  I spent over 40  minutes of face-to-face and non-face-to-face time with patient on the  1. Pain in both hands   2. Pain in both feet    diagnosis.  This included previsit chart review, lab review, study review, order entry, electronic health record documentation, patient education on the different diagnostic and therapeutic options, counseling and coordination of care, risks and benefits of management, compliance, or risk factor reduction

## 2020-01-03 ENCOUNTER — Telehealth: Payer: Self-pay | Admitting: *Deleted

## 2020-01-03 NOTE — Addendum Note (Signed)
Addended by: Naomie Dean B on: 01/03/2020 07:38 AM   Modules accepted: Level of Service

## 2020-01-03 NOTE — Telephone Encounter (Signed)
-----   Message from Anson Fret, MD sent at 01/03/2020  2:24 PM EDT ----- Vitamin B12 is in the low-normal range but Vitamin B12 deficiency can cause neuropathy so I want her to take a B complex vitamin because her folate is abnormal as well. We can prescribe something or she can go to the store and pick up a multi-B vitamin supplement and take it every day for 6 months and see if it makes her feel better. Vitamin deficiences in the B vitamins can cause a lot of side effects. thanks

## 2020-01-03 NOTE — Telephone Encounter (Signed)
Spoke with pt and discussed lab results which indicate low normal B12 and low Folate. Discussed Dr Cathren Laine recommendation to start a multi-B vitamin which we can prescribe or pt can purchase OTC. Take daily x 6 months and see if she feels better. Pt will purchase this OTC. Her questions were answered. She understands B vitamin deficiencies can cause a myriad of symptoms. Pt aware we will only call if we have concerns with the remaining labs. Pt verbalized appreciation for the call.

## 2020-01-04 ENCOUNTER — Other Ambulatory Visit: Payer: Self-pay | Admitting: Neurology

## 2020-01-04 DIAGNOSIS — E539 Vitamin B deficiency, unspecified: Secondary | ICD-10-CM

## 2020-01-04 MED ORDER — FA-PYRIDOXINE-CYANOCOBALAMIN 2.5-25-2 MG PO TABS: 1.0000 | ORAL_TABLET | Freq: Every day | ORAL | 3 refills | Status: DC

## 2020-01-04 NOTE — Progress Notes (Signed)
Pt aware Rx was sent to [facility] for Foltx. She verbalized appreciation.

## 2020-01-04 NOTE — Telephone Encounter (Signed)
Done, thanks

## 2020-01-04 NOTE — Telephone Encounter (Signed)
Pt called, could not find the multi-B vitamin. Would like for you to prescribe it, can send to [facility]

## 2020-01-04 NOTE — Addendum Note (Signed)
Addended by: Bertram Savin on: 01/04/2020 05:41 PM   Modules accepted: Orders

## 2020-01-06 LAB — MULTIPLE MYELOMA PANEL, SERUM
Albumin SerPl Elph-Mcnc: 3.8 g/dL (ref 2.9–4.4)
Albumin/Glob SerPl: 1.3 (ref 0.7–1.7)
Alpha 1: 0.3 g/dL (ref 0.0–0.4)
Alpha2 Glob SerPl Elph-Mcnc: 0.8 g/dL (ref 0.4–1.0)
B-Globulin SerPl Elph-Mcnc: 1.2 g/dL (ref 0.7–1.3)
Gamma Glob SerPl Elph-Mcnc: 0.7 g/dL (ref 0.4–1.8)
Globulin, Total: 3 g/dL (ref 2.2–3.9)
IgA/Immunoglobulin A, Serum: 294 mg/dL (ref 87–352)
IgG (Immunoglobin G), Serum: 765 mg/dL (ref 586–1602)
IgM (Immunoglobulin M), Srm: 81 mg/dL (ref 26–217)
Total Protein: 6.8 g/dL (ref 6.0–8.5)

## 2020-01-06 LAB — HEAVY METALS, BLOOD
Arsenic: <1 ug/L — ABNORMAL LOW (ref 2–23)
Lead, Blood: 2 ug/dL (ref 0–4)
Mercury: <1 ug/L (ref 0.0–14.9)

## 2020-01-06 LAB — HEMOGLOBIN A1C
Est. average glucose Bld gHb Est-mCnc: 114 mg/dL
Hgb A1c MFr Bld: 5.6 % (ref 4.8–5.6)

## 2020-01-06 LAB — SJOGREN'S SYNDROME ANTIBODS(SSA + SSB)
ENA SSA (RO) Ab: <0.2 AI (ref 0.0–0.9)
ENA SSB (LA) Ab: <0.2 AI (ref 0.0–0.9)

## 2020-01-06 LAB — RHEUMATOID FACTOR: Rheumatoid fact SerPl-aCnc: 11.6 IU/mL (ref 0.0–13.9)

## 2020-01-06 LAB — METHYLMALONIC ACID, SERUM: Methylmalonic Acid: 263 nmol/L (ref 0–378)

## 2020-01-06 LAB — VITAMIN B6: Vitamin B6: 7.7 ug/L (ref 2.0–32.8)

## 2020-01-06 LAB — B12 AND FOLATE PANEL
Folate: 3 ng/mL — ABNORMAL LOW (ref >3.0)
Vitamin B-12: 256 pg/mL (ref 232–1245)

## 2020-01-06 LAB — TSH: TSH: 2.54 u[IU]/mL (ref 0.450–4.500)

## 2020-01-06 LAB — SEDIMENTATION RATE: Sed Rate: 4 mm/hr (ref 0–40)

## 2020-01-06 LAB — VITAMIN B1: Thiamine: 119.5 nmol/L (ref 66.5–200.0)

## 2020-01-23 ENCOUNTER — Other Ambulatory Visit: Payer: Self-pay

## 2020-01-23 ENCOUNTER — Ambulatory Visit: Payer: Managed Care, Other (non HMO) | Admitting: Neurology

## 2020-01-23 ENCOUNTER — Encounter: Payer: Managed Care, Other (non HMO) | Admitting: Neurology

## 2020-01-23 ENCOUNTER — Ambulatory Visit (INDEPENDENT_AMBULATORY_CARE_PROVIDER_SITE_OTHER): Payer: Managed Care, Other (non HMO) | Admitting: Neurology

## 2020-01-23 DIAGNOSIS — M79641 Pain in right hand: Secondary | ICD-10-CM | POA: Diagnosis not present

## 2020-01-23 DIAGNOSIS — M79671 Pain in right foot: Secondary | ICD-10-CM

## 2020-01-23 DIAGNOSIS — M79672 Pain in left foot: Secondary | ICD-10-CM

## 2020-01-23 DIAGNOSIS — M79642 Pain in left hand: Secondary | ICD-10-CM

## 2020-01-23 DIAGNOSIS — R202 Paresthesia of skin: Secondary | ICD-10-CM

## 2020-01-23 DIAGNOSIS — Z0289 Encounter for other administrative examinations: Secondary | ICD-10-CM

## 2020-01-23 DIAGNOSIS — G629 Polyneuropathy, unspecified: Secondary | ICD-10-CM

## 2020-01-23 NOTE — Progress Notes (Signed)
      Full Name: Tamara [name] Gender: Female MRN #: [id number] Date of Birth: 03/17/1960    Visit Date: 01/23/2020 14:48 Age: 59 Years Examining Physician: Clemmie Marxen, MD  Referring Physician: Anderson, Marshall W, MD Height: 5 feet 2 inch Patient Weight: 142lbs  History: 59 y.o. female here as requested by Anderson, Marshall W, MD for paresthesias.   Summary: EMG/NCS performed on the bilateral upper extremities and right lower extremity. All nerves and muscles (as indicated in the following tables) were within normal limits.      Conclusion: This is a normal study. No evidence for mononeuropathy, polyneuropathy, radiculopathy or muscle disorder.  Zanobia Griebel, M.D.  [facility] [address], [address] [place], [place] [address] Tel: [phone] Fax: [phone]  Verbal informed consent was obtained from the patient, patient was informed of potential risk of procedure, including bruising, bleeding, hematoma formation, infection, muscle weakness, muscle pain, numbness, among others.        MNC    Nerve / Sites Muscle Latency Ref. Amplitude Ref. Rel Amp Segments Distance Velocity Ref. Area    ms ms mV mV %  cm m/s m/s mVms  L Median - APB     Wrist APB 3.8 ?4.4 0.5(+5.8) ?4.0 100 Wrist - APB 7   2.7     Upper arm APB 6.5  7.4  1446 Upper arm - Wrist 18 66 ?49 26.5     Ulnar Wrist APB 3.7  5.8  78.9 Ulnar Wrist - APB    17.3  R Median - APB     Wrist APB 3.8 ?4.4 8.3 ?4.0 100 Wrist - APB 7   34.6     Upper arm APB 7.7  6.7  80.6 Upper arm - Wrist 19 49 ?49 33.8  L Ulnar - ADM     Wrist ADM 2.4 ?3.3 11.0 ?6.0 100 Wrist - ADM 7   37.4     B.Elbow ADM 5.4  9.8  88.6 B.Elbow - Wrist 17 56 ?49 34.9     A.Elbow ADM 7.3  9.5  97.4 A.Elbow - B.Elbow 10 55 ?49 35.5  R Ulnar - ADM     Wrist ADM 2.5 ?3.3 9.8 ?6.0 100 Wrist - ADM 7   34.3     B.Elbow ADM 5.5  9.0  92.2 B.Elbow - Wrist 18 60 ?49 31.1     A.Elbow ADM 7.2  9.1  101 A.Elbow - B.Elbow 10 58  ?49 34.3             SNC    Nerve / Sites Rec. Site Peak Lat Ref.  Amp Ref. Segments Distance Peak Diff Ref.    ms ms V V  cm ms ms  L Radial - Anatomical snuff box (Forearm)     Forearm Wrist 2.7 ?2.9 31 ?15 Forearm - Wrist 10    R Radial - Anatomical snuff box (Forearm)     Forearm Wrist 2.6 ?2.9 35 ?15 Forearm - Wrist 10    R Sural - Ankle (Calf)     Calf Ankle 3.1 ?4.4 13 ?6 Calf - Ankle 14    R Superficial peroneal - Ankle     Lat leg Ankle 3.6 ?4.4 6 ?6 Lat leg - Ankle 14    L Median, Ulnar - Transcarpal comparison     Median Palm Wrist 2.1 ?2.2 96 ?35 Median Palm - Wrist 8       Ulnar Palm Wrist 2.2 ?2.2 21 ?12   Ulnar Palm - Wrist 8          Median Palm - Ulnar Palm  -0.1 ?0.4  R Median, Ulnar - Transcarpal comparison     Median Palm Wrist 2.2 ?2.2 122 ?35 Median Palm - Wrist 8       Ulnar Palm Wrist 2.2 ?2.2 16 ?12 Ulnar Palm - Wrist 8          Median Palm - Ulnar Palm  0.0 ?0.4  L Median - Orthodromic (Dig II, Mid palm)     Dig II Wrist 3.1 ?3.4 11 ?10 Dig II - Wrist 13    R Median - Orthodromic (Dig II, Mid palm)     Dig II Wrist 3.1 ?3.4 20 ?10 Dig II - Wrist 13    L Ulnar - Orthodromic, (Dig V, Mid palm)     Dig V Wrist 3.1 ?3.1 9 ?5 Dig V - Wrist 11    R Ulnar - Orthodromic, (Dig V, Mid palm)     Dig V Wrist 2.9 ?3.1 6 ?5 Dig V - Wrist 79                           F  Wave    Nerve F Lat Ref.   ms ms  L Ulnar - ADM 26.6 ?32.0  R Ulnar - ADM 26.5 ?32.0         EMG Summary Table    Spontaneous MUAP Recruitment  Muscle IA Fib PSW Fasc Other Amp Dur. Poly Pattern  R. Deltoid Normal None None None _______ Normal Normal Normal Normal  R. Triceps brachii Normal None None None _______ Normal Normal Normal Normal  R. Pronator teres Normal None None None _______ Normal Normal Normal Normal  R. First dorsal interosseous Normal None None None _______ Normal Normal Normal Normal  R. Opponens pollicis Normal None None None _______ Normal Normal Normal Normal

## 2020-01-30 NOTE — Procedures (Signed)
Full Name: Tamara [name] Gender: Female MRN #: [id number] Date of Birth: [birth year]    Visit Date: 01/23/2020 14:48 Age: 59 Years Examining Physician: Naomie Dean, MD  Referring Physician: Lauro Regulus, MD Height: 5 feet 2 inch Patient Weight: 142lbs  History: 59 y.o. female here as requested by Lauro Regulus, MD for paresthesias.   Summary: EMG/NCS performed on the bilateral upper extremities and right lower extremity. All nerves and muscles (as indicated in the following tables) were within normal limits.      Conclusion: This is a normal study. No evidence for mononeuropathy, polyneuropathy, radiculopathy or muscle disorder.  Naomie Dean, M.D.  [facility] [address], [address] [place], [place] [address] Tel: [phone] Fax: [phone]  Verbal informed consent was obtained from the patient, patient was informed of potential risk of procedure, including bruising, bleeding, hematoma formation, infection, muscle weakness, muscle pain, numbness, among others.        MNC    Nerve / Sites Muscle Latency Ref. Amplitude Ref. Rel Amp Segments Distance Velocity Ref. Area    ms ms mV mV %  cm m/s m/s mVms  L Median - APB     Wrist APB 3.8 ?4.4 0.5(+5.8) ?4.0 100 Wrist - APB 7   2.7     Upper arm APB 6.5  7.4  1446 Upper arm - Wrist 18 66 ?49 26.5     Ulnar Wrist APB 3.7  5.8  78.9 Ulnar Wrist - APB    17.3  R Median - APB     Wrist APB 3.8 ?4.4 8.3 ?4.0 100 Wrist - APB 7   34.6     Upper arm APB 7.7  6.7  80.6 Upper arm - Wrist 19 49 ?49 33.8  L Ulnar - ADM     Wrist ADM 2.4 ?3.3 11.0 ?6.0 100 Wrist - ADM 7   37.4     B.Elbow ADM 5.4  9.8  88.6 B.Elbow - Wrist 17 56 ?49 34.9     A.Elbow ADM 7.3  9.5  97.4 A.Elbow - B.Elbow 10 55 ?49 35.5  R Ulnar - ADM     Wrist ADM 2.5 ?3.3 9.8 ?6.0 100 Wrist - ADM 7   34.3     B.Elbow ADM 5.5  9.0  92.2 B.Elbow - Wrist 18 60 ?49 31.1     A.Elbow ADM 7.2  9.1  101 A.Elbow - B.Elbow 10 58  ?49 34.3             SNC    Nerve / Sites Rec. Site Peak Lat Ref.  Amp Ref. Segments Distance Peak Diff Ref.    ms ms V V  cm ms ms  L Radial - Anatomical snuff box (Forearm)     Forearm Wrist 2.7 ?2.9 31 ?15 Forearm - Wrist 10    R Radial - Anatomical snuff box (Forearm)     Forearm Wrist 2.6 ?2.9 35 ?15 Forearm - Wrist 10    R Sural - Ankle (Calf)     Calf Ankle 3.1 ?4.4 13 ?6 Calf - Ankle 14    R Superficial peroneal - Ankle     Lat leg Ankle 3.6 ?4.4 6 ?6 Lat leg - Ankle 14    L Median, Ulnar - Transcarpal comparison     Median Palm Wrist 2.1 ?2.2 96 ?35 Median Palm - Wrist 8       Ulnar Palm Wrist 2.2 ?2.2 21 ?12  Ulnar Palm - Wrist 8          Median Palm - Ulnar Palm  -0.1 ?0.4  R Median, Ulnar - Transcarpal comparison     Median Palm Wrist 2.2 ?2.2 122 ?35 Median Palm - Wrist 8       Ulnar Palm Wrist 2.2 ?2.2 16 ?12 Ulnar Palm - Wrist 8          Median Palm - Ulnar Palm  0.0 ?0.4  L Median - Orthodromic (Dig II, Mid palm)     Dig II Wrist 3.1 ?3.4 11 ?10 Dig II - Wrist 13    R Median - Orthodromic (Dig II, Mid palm)     Dig II Wrist 3.1 ?3.4 20 ?10 Dig II - Wrist 13    L Ulnar - Orthodromic, (Dig V, Mid palm)     Dig V Wrist 3.1 ?3.1 9 ?5 Dig V - Wrist 11    R Ulnar - Orthodromic, (Dig V, Mid palm)     Dig V Wrist 2.9 ?3.1 6 ?5 Dig V - Wrist 79                           F  Wave    Nerve F Lat Ref.   ms ms  L Ulnar - ADM 26.6 ?32.0  R Ulnar - ADM 26.5 ?32.0         EMG Summary Table    Spontaneous MUAP Recruitment  Muscle IA Fib PSW Fasc Other Amp Dur. Poly Pattern  R. Deltoid Normal None None None _______ Normal Normal Normal Normal  R. Triceps brachii Normal None None None _______ Normal Normal Normal Normal  R. Pronator teres Normal None None None _______ Normal Normal Normal Normal  R. First dorsal interosseous Normal None None None _______ Normal Normal Normal Normal  R. Opponens pollicis Normal None None None _______ Normal Normal Normal Normal

## 2020-01-30 NOTE — Progress Notes (Signed)
See procedure note.

## 2020-02-27 ENCOUNTER — Institutional Professional Consult (permissible substitution): Payer: Managed Care, Other (non HMO) | Admitting: Neurology

## 2020-04-25 ENCOUNTER — Telehealth: Payer: Self-pay | Admitting: Neurology

## 2020-04-25 NOTE — Telephone Encounter (Signed)
Pt called, can you send a referral to Rheumatology at [facility]. I need referral for the pain in my hand.  Fax info: [phone]. Would like a call from the nurse.

## 2020-04-25 NOTE — Telephone Encounter (Signed)
Yes, referrals should go through primary care thanks

## 2020-04-26 NOTE — Telephone Encounter (Signed)
Spoke with patient and advised per Dr Lucia Gaskins that the Rheumatology referral should go through primary care. Patient verbalized understanding and appreciation for the call.

## 2020-06-06 ENCOUNTER — Other Ambulatory Visit: Payer: Self-pay | Admitting: *Deleted

## 2020-06-06 DIAGNOSIS — Z122 Encounter for screening for malignant neoplasm of respiratory organs: Secondary | ICD-10-CM

## 2020-06-06 DIAGNOSIS — F17200 Nicotine dependence, unspecified, uncomplicated: Secondary | ICD-10-CM

## 2020-06-06 DIAGNOSIS — Z87891 Personal history of nicotine dependence: Secondary | ICD-10-CM

## 2020-06-06 NOTE — Progress Notes (Signed)
Contacted and scheduled for annual lung screening scan. Patient is a current smoker with a 38 pack year history 

## 2020-08-16 ENCOUNTER — Ambulatory Visit: Admission: RE | Admit: 2020-08-16 | Payer: Managed Care, Other (non HMO) | Source: Ambulatory Visit

## 2020-08-16 ENCOUNTER — Ambulatory Visit
Admission: RE | Admit: 2020-08-16 | Discharge: 2020-08-16 | Disposition: A | Discharge status: Home / Self Care | Payer: Managed Care, Other (non HMO) | Source: Ambulatory Visit | Attending: Nurse Practitioner | Admitting: Nurse Practitioner

## 2020-08-16 ENCOUNTER — Other Ambulatory Visit: Payer: Self-pay

## 2020-08-16 DIAGNOSIS — F17200 Nicotine dependence, unspecified, uncomplicated: Secondary | ICD-10-CM | POA: Diagnosis present

## 2020-08-16 DIAGNOSIS — Z87891 Personal history of nicotine dependence: Secondary | ICD-10-CM | POA: Diagnosis present

## 2020-08-16 DIAGNOSIS — Z122 Encounter for screening for malignant neoplasm of respiratory organs: Secondary | ICD-10-CM

## 2020-08-23 ENCOUNTER — Encounter: Payer: Self-pay | Admitting: *Deleted

## 2020-08-29 ENCOUNTER — Other Ambulatory Visit
Admission: RE | Admit: 2020-08-29 | Discharge: 2020-08-29 | Disposition: A | Discharge status: Home / Self Care | Payer: Managed Care, Other (non HMO) | Source: Ambulatory Visit | Attending: Pulmonary Disease | Admitting: Pulmonary Disease

## 2020-08-29 DIAGNOSIS — J441 Chronic obstructive pulmonary disease with (acute) exacerbation: Secondary | ICD-10-CM | POA: Diagnosis not present

## 2020-08-29 LAB — D-DIMER, QUANTITATIVE: D-Dimer, Quant: 0.84 ug/mL-FEU — ABNORMAL HIGH (ref 0.00–0.50)

## 2020-08-31 ENCOUNTER — Ambulatory Visit
Admission: RE | Admit: 2020-08-31 | Discharge: 2020-08-31 | Disposition: A | Discharge status: Home / Self Care | Payer: Managed Care, Other (non HMO) | Source: Ambulatory Visit | Attending: Pulmonary Disease | Admitting: Pulmonary Disease

## 2020-08-31 ENCOUNTER — Other Ambulatory Visit: Payer: Self-pay | Admitting: Pulmonary Disease

## 2020-08-31 DIAGNOSIS — R7989 Other specified abnormal findings of blood chemistry: Secondary | ICD-10-CM

## 2020-08-31 MED ORDER — IOPAMIDOL (ISOVUE-370) INJECTION 76%
75.0000 mL | Freq: Once | INTRAVENOUS | Status: AC | PRN
  Administered 2020-08-31: 75 mL via INTRAVENOUS

## 2020-12-12 ENCOUNTER — Other Ambulatory Visit: Payer: Self-pay | Admitting: Internal Medicine

## 2020-12-12 DIAGNOSIS — Z1231 Encounter for screening mammogram for malignant neoplasm of breast: Secondary | ICD-10-CM

## 2020-12-26 ENCOUNTER — Other Ambulatory Visit: Payer: Self-pay

## 2020-12-26 ENCOUNTER — Ambulatory Visit
Admission: RE | Admit: 2020-12-26 | Discharge: 2020-12-26 | Disposition: A | Discharge status: Home / Self Care | Payer: Managed Care, Other (non HMO) | Source: Ambulatory Visit | Attending: Internal Medicine | Admitting: Internal Medicine

## 2020-12-26 DIAGNOSIS — Z1231 Encounter for screening mammogram for malignant neoplasm of breast: Secondary | ICD-10-CM | POA: Insufficient documentation

## 2021-01-28 ENCOUNTER — Other Ambulatory Visit: Payer: Self-pay | Admitting: *Deleted

## 2021-01-28 DIAGNOSIS — E539 Vitamin B deficiency, unspecified: Secondary | ICD-10-CM

## 2021-01-28 MED ORDER — FA-PYRIDOXINE-CYANOCOBALAMIN 2.5-25-2 MG PO TABS: 1.0000 | ORAL_TABLET | Freq: Every day | ORAL | 0 refills | Status: AC

## 2021-04-11 ENCOUNTER — Other Ambulatory Visit: Payer: Self-pay | Admitting: Optometrist

## 2021-04-11 ENCOUNTER — Other Ambulatory Visit: Payer: Self-pay | Admitting: Optometry

## 2021-04-11 DIAGNOSIS — H5500 Unspecified nystagmus: Secondary | ICD-10-CM

## 2021-04-19 ENCOUNTER — Other Ambulatory Visit: Payer: Self-pay | Admitting: Optometry

## 2021-04-19 DIAGNOSIS — H5500 Unspecified nystagmus: Secondary | ICD-10-CM

## 2021-04-30 ENCOUNTER — Ambulatory Visit
Admission: RE | Admit: 2021-04-30 | Discharge: 2021-04-30 | Disposition: A | Discharge status: Home / Self Care | Payer: Managed Care, Other (non HMO) | Source: Ambulatory Visit | Attending: Optometry | Admitting: Optometry

## 2021-04-30 DIAGNOSIS — H5500 Unspecified nystagmus: Secondary | ICD-10-CM

## 2021-04-30 MED ORDER — GADOBENATE DIMEGLUMINE 529 MG/ML IV SOLN
12.0000 mL | Freq: Once | INTRAVENOUS | Status: AC | PRN
  Administered 2021-04-30: 12 mL via INTRAVENOUS

## 2021-05-02 ENCOUNTER — Other Ambulatory Visit (HOSPITAL_COMMUNITY): Payer: Self-pay | Admitting: Neuroradiology

## 2021-05-02 ENCOUNTER — Telehealth (HOSPITAL_COMMUNITY): Payer: Self-pay

## 2021-05-02 DIAGNOSIS — I671 Cerebral aneurysm, nonruptured: Secondary | ICD-10-CM

## 2021-05-02 NOTE — Telephone Encounter (Signed)
Called to schedule consult with Dr. Rodrigues, no answer, left vm. AW  

## 2021-05-03 ENCOUNTER — Ambulatory Visit (HOSPITAL_COMMUNITY)
Admission: RE | Admit: 2021-05-03 | Discharge: 2021-05-03 | Disposition: A | Discharge status: Home / Self Care | Payer: Managed Care, Other (non HMO) | Source: Ambulatory Visit | Attending: Neuroradiology | Admitting: Neuroradiology

## 2021-05-03 ENCOUNTER — Other Ambulatory Visit: Payer: Self-pay

## 2021-05-03 DIAGNOSIS — I671 Cerebral aneurysm, nonruptured: Secondary | ICD-10-CM

## 2021-05-03 NOTE — Consult Note (Signed)
Chief Complaint: Patient was seen in consultation today for left ICA aneurysm.  Referring Physician(s): Delia Chimes  Supervising Physician: Pedro Earls  Patient Status: [facility] - Out-pt  History of Present Illness: Tamara [name] is a 61 year old female with incidental left ICA ophthalmic segment aneurysm found on MRI of the brain performed for nystagmus workup.  Patient refers jumpy" eyes with worsening of vision and worsening headache for approximately 6 months.  She sought an appointment with ophthalmology for new glasses prescription.  However, eye contractions have not improved.     Her past medical history significant for migraines, hyperlipidemia, tobacco abuse (20 pack-year), chronic pain syndrome, cervical disc disorder, cervical and thoracic and lumbar disc disease and pain, cervical postlaminectomy syndrome, osteoarthritis and COPD.   Family history is significant for father with ruptured brain aneurysm, father and brother with abdominal aneurysm (uncertain what vessel) and mother with thoracic aorta aneurysm.   MRI performed on 04/30/2021 was positive for ectatic supraclinoid left ICA with associated 5 mm of ophthalmic segment aneurysm in addition to mild chronic white matter disease.   Past Medical History:  Diagnosis Date   Anesthesia complication    slow to awaken with past procedures, also caused nausea   Cervical pain    Cervical post-laminectomy syndrome    Chronic pain syndrome    COPD (chronic obstructive pulmonary disease) (HCC)    Degenerative lumbar disc    Emphysema lung (HCC)    GERD (gastroesophageal reflux disease)    H/O ulcer disease    High cholesterol    Migraine    Osteoarthritis    Spinal stenosis    Thoracic spine pain     Past Surgical History:  Procedure Laterality Date   APPENDECTOMY     BREAST CYST EXCISION Bilateral yrs ago   3 areas on the right and 1 on left   CATARACT EXTRACTION Right 03/14/2019    CATARACT EXTRACTION Left 03/21/2019   Hysterectomy Complete     NECK SURGERY  06/2013   plating w/ screws    TONSILLECTOMY      Allergies: Cefdinir, Codeine, Hydrocodone-acetaminophen, and Sulfa antibiotics  Medications: Prior to Admission medications   Medication Sig Start Date End Date Taking? Authorizing Provider  atorvastatin (LIPITOR) 40 MG tablet Take 40 mg by mouth daily. 03/27/17   [provider]  Budeson-Glycopyrrol-Formoterol (BREZTRI AEROSPHERE) 160-9-4.8 MCG/ACT AERO Take 2 puffs by mouth 2 (two) times daily.    [provider]  celecoxib (CELEBREX) 200 MG capsule Take 200 mg by mouth 2 (two) times daily as needed. 03/27/17   [provider]  clonazePAM (KLONOPIN) 0.5 MG tablet Take 0.5 mg by mouth 2 (two) times daily as needed.    [provider]  cyclobenzaprine (FLEXERIL) 10 MG tablet Take 10 mg by mouth. 1 tablet 1-2 times daily as needed 03/27/17   [provider]  esomeprazole (NEXIUM) 40 MG capsule Take 40 mg by mouth 2 (two) times daily. 03/27/17   [provider]  fluticasone (FLONASE) 50 MCG/ACT nasal spray Place 2 sprays into both nostrils daily. 03/27/17   [provider]  folic acid-pyridoxine-cyancobalamin (FOLTX) 2.5-25-2 MG TABS tablet Take 1 tablet by mouth daily. 01/28/21   Melvenia Beam, MD  gabapentin (NEURONTIN) 300 MG capsule Take 2 capsules by mouth 3 (three) times daily. 03/27/17   [provider]  loratadine (CLARITIN) 10 MG tablet Take 10 mg by mouth daily. 02/27/17   [provider]  lubiprostone (AMITIZA) 8 MCG capsule  lubiprostone 8 mcg capsule  Take 1 capsule (8 mcg total) by mouth 2 (two) times daily with meals for 30 days 08/19/19   [provider]  oxyCODONE-acetaminophen (PERCOCET/ROXICET) 5-325 MG tablet Take 1 tablet by mouth 3 (three) times daily as needed. 04/02/17   [provider]  OXYGEN Inhale 2 L into the lungs. While at home    [provider]  SUMAtriptan (IMITREX) 100 MG tablet Take 1 tablet by mouth once as needed for migraine. May take a second dose after 2 hours if needed. 03/27/17   [provider]  traZODone (DESYREL) 50 MG tablet Take 1 tablet by mouth at bedtime as needed.  12/19/19 12/18/20  [provider]     Family History  Problem Relation Age of Onset   Cancer Mother        multiple myeloma   Heart disease Mother    Hypertension Mother    Aneurysm Mother        thoracic   Cancer Father        lung cancer   Cancer Brother    Breast cancer Neg Hx    Neuropathy Neg Hx     Social History   Socioeconomic History   Marital status: Single    Spouse name: Not on file   Number of children: 2   Years of education: Not on file   Highest education level: Some college, no degree  Occupational History   Not on file  Tobacco Use   Smoking status: Every Day    Packs/day: 0.50    Years: 36.00    Pack years: 18.00    Types: Cigarettes   Smokeless tobacco: Never  Vaping Use   Vaping Use: Never used  Substance and Sexual Activity   Alcohol use: No   Drug use: No   Sexual activity: Not on file  Other Topics Concern   Not on file  Social History Narrative   Right handed   Lives at home with buddy swaney   Social Determinants of Health   Financial Resource Strain: Not on file  Food Insecurity: Not on file  Transportation Needs: Not on file  Physical Activity: Not on file  Stress: Not on file  Social Connections: Not on file     Review of Systems: A 12 point ROS discussed and pertinent positives are indicated in the HPI above.  All other systems are negative.  Review of Systems  Vital Signs: There were no vitals taken for this visit.  Physical Exam Constitutional:      Appearance: Normal appearance.  HENT:     Head: Normocephalic and atraumatic.  Eyes:     Extraocular Movements:     Right eye: Abnormal extraocular motion present.     Left eye: Abnormal  extraocular motion present.     Pupils: Pupils are equal, round, and reactive to light.     Comments: Intermittent and symmetric spasms of bilateral inferior eyelid.  Neurological:     Mental Status: She is alert and oriented to person, place, and time.     Cranial Nerves: Cranial nerves 2-12 are intact.     Motor: Weakness and tremor present.     Comments: Bilateral upper extremity resting tremor. Mild decreased strength of left lower extremity that patient attributes to worsening back pain since lying flat for MRI.         Imaging: MR BRAIN W WO CONTRAST  Result Date: 05/01/2021 CLINICAL DATA:  Unspecified nystagmus H55.00 (ICD-10-CM).  EXAM: MRI HEAD WITHOUT AND WITH CONTRAST TECHNIQUE: Multiplanar, multiecho pulse sequences of the brain and surrounding structures were obtained without and with intravenous contrast. CONTRAST:  60m MULTIHANCE GADOBENATE DIMEGLUMINE 529 MG/ML IV SOLN COMPARISON:  MRI of the brain April 20, 2017. FINDINGS: Brain: No acute infarction, hemorrhage, hydrocephalus, extra-axial collection or mass lesion. Scattered foci of T2 hyperintensity are seen within the white matter of the cerebral hemispheres, nonspecific with multiple new or enlarged foci since prior MRI. Mild parenchymal volume loss. Low lying cerebellar tonsils consistent with Chiari 1 malformation. No focus of abnormal contrast enhancement. 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 Skull and upper cervical spine: No focal marrow lesion identified. Sinuses/Orbits: Bilateral lens surgery. Paranasal sinuses are clear. Other: None. IMPRESSION: 1. Mild chronic white matter lesions in a nonspecific distribution, mildly progressed from prior MRI, most likely related to chronic microangiopathy. Differential diagnosis include demyelinating disease, autoimmune process and post inflammatory/infectious processes. 2. Chiari 1 malformation. 3. Ectatic supraclinoid left  ICA with associated 5 mm of ophthalmic segment aneurysm. Neuro interventional consultation suggested. Electronically Signed   By: KPedro EarlsM.D.   On: 05/01/2021 16:05    Labs:  CBC: No results for input(s): WBC, HGB, HCT, PLT in the last 8760 hours.  COAGS: No results for input(s): INR, APTT in the last 8760 hours.  BMP: No results for input(s): NA, K, CL, CO2, GLUCOSE, BUN, CALCIUM, CREATININE, GFRNONAA, GFRAA in the last 8760 hours.  Invalid input(s): CMP  LIVER FUNCTION TESTS: No results for input(s): BILITOT, AST, ALT, ALKPHOS, PROT, ALBUMIN in the last 8760 hours.  TUMOR MARKERS: No results for input(s): AFPTM, CEA, CA199, CHROMGRNA in the last 8760 hours.  Assessment and Plan:  AZoriah Puliceis a pleasant 61year old female with incidental left ICA ophthalmic segment aneurysm and significant risk factors or aneurysm rupture, including smoking and family history of aneurysmal subarachnoid hemorrhage.  I recommended a diagnostic cerebral angiogram under moderate sedation to better evaluate aneurysm size and shape, its relationship with the parent artery and evaluation for possible additional aneurysms given that no vascular imaging has been performed.  This would also help in treatment planning should she decide to pursue treatment.  She would like to think about it over the weekend and will call [place] with a decision on Monday.  With regards to her eyelid twitching and hand resting tremors, I recommend evaluation by a movement disorder specialist, Dr. RWells GuilesTat.  Thank you for this interesting consult.  I greatly enjoyed meeting ADanetta Promand look forward to participating in her care.  A copy of this report was sent to the requesting provider on this date.  Electronically Signed: KPedro Earls MD 05/03/2021, 4:18 PM   I spent a total of  60 Minutes   in face to face in clinical consultation, greater than 50% of which was  counseling/coordinating care for left ICA aneurysm

## 2021-05-14 ENCOUNTER — Other Ambulatory Visit (HOSPITAL_COMMUNITY): Payer: Self-pay | Admitting: Neuroradiology

## 2021-05-14 DIAGNOSIS — I671 Cerebral aneurysm, nonruptured: Secondary | ICD-10-CM

## 2021-05-16 ENCOUNTER — Other Ambulatory Visit: Payer: Self-pay | Admitting: Radiology

## 2021-05-16 ENCOUNTER — Other Ambulatory Visit: Payer: Self-pay | Admitting: Internal Medicine

## 2021-05-17 ENCOUNTER — Other Ambulatory Visit (HOSPITAL_COMMUNITY): Payer: Self-pay | Admitting: Neuroradiology

## 2021-05-17 ENCOUNTER — Other Ambulatory Visit: Payer: Self-pay

## 2021-05-17 ENCOUNTER — Ambulatory Visit (HOSPITAL_COMMUNITY)
Admission: RE | Admit: 2021-05-17 | Discharge: 2021-05-17 | Disposition: A | Discharge status: Home / Self Care | Payer: Managed Care, Other (non HMO) | Source: Ambulatory Visit | Attending: Neuroradiology | Admitting: Neuroradiology

## 2021-05-17 ENCOUNTER — Encounter (HOSPITAL_COMMUNITY): Payer: Self-pay

## 2021-05-17 ENCOUNTER — Telehealth (HOSPITAL_COMMUNITY): Payer: Self-pay | Admitting: Radiology

## 2021-05-17 DIAGNOSIS — F17210 Nicotine dependence, cigarettes, uncomplicated: Secondary | ICD-10-CM | POA: Insufficient documentation

## 2021-05-17 DIAGNOSIS — I671 Cerebral aneurysm, nonruptured: Secondary | ICD-10-CM

## 2021-05-17 DIAGNOSIS — Z7951 Long term (current) use of inhaled steroids: Secondary | ICD-10-CM | POA: Insufficient documentation

## 2021-05-17 DIAGNOSIS — G935 Compression of brain: Secondary | ICD-10-CM | POA: Diagnosis not present

## 2021-05-17 DIAGNOSIS — H5500 Unspecified nystagmus: Secondary | ICD-10-CM | POA: Insufficient documentation

## 2021-05-17 DIAGNOSIS — E7800 Pure hypercholesterolemia, unspecified: Secondary | ICD-10-CM | POA: Diagnosis not present

## 2021-05-17 DIAGNOSIS — M961 Postlaminectomy syndrome, not elsewhere classified: Secondary | ICD-10-CM | POA: Insufficient documentation

## 2021-05-17 DIAGNOSIS — J449 Chronic obstructive pulmonary disease, unspecified: Secondary | ICD-10-CM | POA: Diagnosis not present

## 2021-05-17 DIAGNOSIS — G894 Chronic pain syndrome: Secondary | ICD-10-CM | POA: Diagnosis not present

## 2021-05-17 DIAGNOSIS — K219 Gastro-esophageal reflux disease without esophagitis: Secondary | ICD-10-CM | POA: Diagnosis not present

## 2021-05-17 DIAGNOSIS — Z79899 Other long term (current) drug therapy: Secondary | ICD-10-CM | POA: Insufficient documentation

## 2021-05-17 DIAGNOSIS — G43909 Migraine, unspecified, not intractable, without status migrainosus: Secondary | ICD-10-CM | POA: Insufficient documentation

## 2021-05-17 HISTORY — PX: IR ANGIO VERTEBRAL SEL VERTEBRAL BILAT MOD SED: IMG5369

## 2021-05-17 HISTORY — PX: IR US GUIDE VASC ACCESS RIGHT: IMG2390

## 2021-05-17 HISTORY — PX: IR ANGIO INTRA EXTRACRAN SEL INTERNAL CAROTID BILAT MOD SED: IMG5363

## 2021-05-17 LAB — BASIC METABOLIC PANEL
Anion gap: 9 (ref 5–15)
BUN: 5 mg/dL — ABNORMAL LOW (ref 8–23)
CO2: 29 mmol/L (ref 22–32)
Calcium: 9.2 mg/dL (ref 8.9–10.3)
Chloride: 101 mmol/L (ref 98–111)
Creatinine, Ser: 0.77 mg/dL (ref 0.44–1.00)
GFR, Estimated: >60 mL/min (ref >60)
Glucose, Bld: 92 mg/dL (ref 70–99)
Potassium: 3.7 mmol/L (ref 3.5–5.1)
Sodium: 139 mmol/L (ref 135–145)

## 2021-05-17 LAB — CBC
HCT: 42.1 % (ref 36.0–46.0)
Hemoglobin: 14.4 g/dL (ref 12.0–15.0)
MCH: 30.1 pg (ref 26.0–34.0)
MCHC: 34.2 g/dL (ref 30.0–36.0)
MCV: 88.1 fL (ref 80.0–100.0)
Platelets: 271 10*3/uL (ref 150–400)
RBC: 4.78 MIL/uL (ref 3.87–5.11)
RDW: 14.6 % (ref 11.5–15.5)
WBC: 9.8 10*3/uL (ref 4.0–10.5)
nRBC: 0 % (ref 0.0–0.2)

## 2021-05-17 LAB — PROTIME-INR
INR: 1 (ref 0.8–1.2)
Prothrombin Time: 12.7 seconds (ref 11.4–15.2)

## 2021-05-17 MED ORDER — HEPARIN SODIUM (PORCINE) 1000 UNIT/ML IJ SOLN
INTRAMUSCULAR | Status: AC | PRN
  Administered 2021-05-17: 5000 [IU] via INTRAVENOUS

## 2021-05-17 MED ORDER — FENTANYL CITRATE (PF) 100 MCG/2ML IJ SOLN
INTRAMUSCULAR | Status: AC
  Filled 2021-05-17: qty 2

## 2021-05-17 MED ORDER — SODIUM CHLORIDE 0.9 % IV SOLN: Freq: Once | INTRAVENOUS | Status: AC

## 2021-05-17 MED ORDER — FENTANYL CITRATE (PF) 100 MCG/2ML IJ SOLN
INTRAMUSCULAR | Status: AC | PRN
  Administered 2021-05-17 (×2): 25 ug via INTRAVENOUS

## 2021-05-17 MED ORDER — VERAPAMIL HCL 2.5 MG/ML IV SOLN
INTRAVENOUS | Status: AC
  Filled 2021-05-17: qty 2

## 2021-05-17 MED ORDER — IOHEXOL 300 MG/ML  SOLN
100.0000 mL | Freq: Once | INTRAMUSCULAR | Status: AC | PRN
  Administered 2021-05-17: 80 mL via INTRA_ARTERIAL

## 2021-05-17 MED ORDER — NITROGLYCERIN 1 MG/10 ML FOR IR/CATH LAB
INTRA_ARTERIAL | Status: AC
  Filled 2021-05-17: qty 10

## 2021-05-17 MED ORDER — NITROGLYCERIN 1 MG/10 ML FOR IR/CATH LAB
INTRA_ARTERIAL | Status: AC | PRN
  Administered 2021-05-17: 200 ug via INTRA_ARTERIAL

## 2021-05-17 MED ORDER — MIDAZOLAM HCL 2 MG/2ML IJ SOLN
INTRAMUSCULAR | Status: AC
  Filled 2021-05-17: qty 2

## 2021-05-17 MED ORDER — MIDAZOLAM HCL 2 MG/2ML IJ SOLN
INTRAMUSCULAR | Status: AC | PRN
  Administered 2021-05-17: .5 mg via INTRAVENOUS
  Administered 2021-05-17: 1 mg via INTRAVENOUS

## 2021-05-17 MED ORDER — VERAPAMIL HCL 2.5 MG/ML IV SOLN
INTRAVENOUS | Status: AC | PRN
  Administered 2021-05-17: 5 mg via INTRA_ARTERIAL

## 2021-05-17 MED ORDER — LIDOCAINE HCL 1 % IJ SOLN
INTRAMUSCULAR | Status: AC
  Filled 2021-05-17: qty 20

## 2021-05-17 MED ORDER — HEPARIN SODIUM (PORCINE) 1000 UNIT/ML IJ SOLN
INTRAMUSCULAR | Status: AC
Start: 2021-05-17 — End: 2021-05-17
  Filled 2021-05-17: qty 10

## 2021-05-17 NOTE — Sedation Documentation (Signed)
Patient transported to short stay. Page RN at the bedside to receive patient. Right wrist TR band in place. Clean, dry and intact. No drainage noted. Soft to touch, no hematoma noted. +1 radial pulse palpated.  ?

## 2021-05-17 NOTE — Discharge Instructions (Addendum)
Drink plenty of fluids for 48 hours and keep wrist elevated at heart level for 24 hours ? ?Radial Site Care ? ? ?This sheet gives you information about how to care for yourself after your procedure. Your health care provider may also give you more specific instructions. If you have problems or questions, contact your health care provider. ?What can I expect after the procedure? ?After the procedure, it is common to have: ?Bruising and tenderness at the catheter insertion area. ?Follow these instructions at home: ?Medicines ?Take over-the-counter and prescription medicines only as told by your health care provider. ?Insertion site care ?Follow instructions from your health care provider about how to take care of your insertion site. Make sure you: ?Wash your hands with soap and water before you change your bandage (dressing). If soap and water are not available, use hand sanitizer. ?Remove your dressing as told by your health care provider. In 24 hours ?Check your insertion site every day for signs of infection. Check for: ?Redness, swelling, or pain. ?Fluid or blood. ?Pus or a bad smell. ?Warmth. ?Do not take baths, swim, or use a hot tub until your health care provider approves. ?You may shower 24-48 hours after the procedure, or as directed by your health care provider. ?Remove the dressing and gently wash the site with plain soap and water. ?Pat the area dry with a clean towel. ?Do not rub the site. That could cause bleeding. ?Do not apply powder or lotion to the site. ?Activity ? ? ?For 24 hours after the procedure, or as directed by your health care provider: ?Do not flex or bend the affected arm. ?Do not push or pull heavy objects with the affected arm. ?Do not drive yourself home from the hospital or clinic. You may drive 24 hours after the procedure unless your health care provider tells you not to. ?Do not operate machinery or power tools. ?Do not lift anything that is heavier than 10 lb (4.5 kg), or the  limit that you are told, until your health care provider says that it is safe.  For 4 days ?Ask your health care provider when it is okay to: ?Return to work or school. ?Resume usual physical activities or sports. ?Resume sexual activity. ?General instructions ?If the catheter site starts to bleed, raise your arm and put firm pressure on the site. If the bleeding does not stop, get help right away. This is a medical emergency. ?If you went home on the same day as your procedure, a responsible adult should be with you for the first 24 hours after you arrive home. ?Keep all follow-up visits as told by your health care provider. This is important. ?Contact a health care provider if: ?You have a fever. ?You have redness, swelling, or yellow drainage around your insertion site. ?Get help right away if: ?You have unusual pain at the radial site. ?The catheter insertion area swells very fast. ?The insertion area is bleeding, and the bleeding does not stop when you hold steady pressure on the area. ?Your arm or hand becomes pale, cool, tingly, or numb. ?These symptoms may represent a serious problem that is an emergency. Do not wait to see if the symptoms will go away. Get medical help right away. Call your local emergency services (911 in the [place] Do not drive yourself to the hospital. ?Summary ?After the procedure, it is common to have bruising and tenderness at the site. ?Follow instructions from your health care provider about how to take care of your  radial site wound. Check the wound every day for signs of infection. ?Do not lift anything that is heavier than 10 lb (4.5 kg), or the limit that you are told, until your health care provider says that it is safe. ?This information is not intended to replace advice given to you by your health care provider. Make sure you discuss any questions you have with your health care provider. ?Document Revised: 04/01/2017 Document Reviewed: 04/01/2017 ?Elsevier Patient Education ?  2020 [employer]  ? ?Get help right away if: ?You have a lot of pain in the insertion area. ?The insertion area swells very fast. ?The insertion area is bleeding, and the bleeding does not stop after you hold steady pressure on the area. ?The area around the insertion area becomes pale, cool, tingly, or numb. ?You have chest pain. ?You have trouble breathing. ?You have a rash. ?You have any signs of a stroke. "BE FAST" is an easy way to remember the main warning signs: ?B - Balance. Signs are dizziness, sudden trouble walking, or loss of balance. ?E - Eyes. Signs are trouble seeing or a change in how you see. ?F - Face. Signs are sudden weakness or loss of feeling of the face, or the face or eyelid drooping on one side. ?A - Arms. Signs are weakness or loss of feeling in an arm. This happens suddenly and usually on one side of the body. ?S - Speech. Signs are sudden trouble speaking, slurred speech, or trouble understanding what people say. ?T - Time. Time to call emergency services. Write down what time symptoms started. ?You have other signs of a stroke, such as: ?A sudden, very bad headache with no known cause. ?Feeling like you may vomit (nausea). ?Vomiting. ?A seizure. ?

## 2021-05-17 NOTE — Procedures (Signed)
INTERVENTIONAL NEURORADIOLOGY BRIEF POSTPROCEDURE NOTE ? ?DIAGNOSTIC CEREBRAL ANGIOGRAM ? ?Attending: Dr. Baldemar Lenis ? ?Assistant: None. ? ?Diagnosis: Left ICA aneurysm. ? ?Access site: Right radial artery. ? ?Access closure: Inflatable band. ? ?Anesthesia: Moderate sedation. ? ?Medication used: 1.5  Mg Versed IV; 50 mcg Fentanyl IV. ? ?Complications: None. ? ?Estimated blood loss: Negligible. ? ?Specimen: None. ? ?Findings:  A 5 x 4 mm left ICA ophthalmic segment aneurysm involving the origin of the left ophthalmic artery, amenable to endovascular treatment. No other aneurysm identified. ? ?The patient tolerated the procedure well without incident or complication and is in stable condition. ? ?She will return for office appointment to discuss aneurysm management options.  ? ? ?  ?

## 2021-05-17 NOTE — H&P (Signed)
Chief Complaint: Headache Nystagmus  Referring Physician(s): Delia Chimes  Supervising Physician: Pedro Earls  Patient Status: [facility] - Out-pt  History of Present Illness: Tamara [name] is a 61 y.o. female with  medical issues including migraines, hyperlipidemia, tobacco abuse (20 pack-year), chronic pain syndrome, cervical disc disorder, cervical and thoracic and lumbar disc disease and pain, cervical postlaminectomy syndrome, osteoarthritis and COPD.  She states she has jumpy" eyes, worsening vision and worsening headache for approximately 6 months.    She saw opthalmology and got new glasses, but her nystagmus did not improve.   MRI done 04/30/2021 showed=  1. Mild chronic white matter lesions in a nonspecific distribution, mildly progressed from prior MRI, most likely related to chronic microangiopathy. Differential diagnosis include demyelinating disease, autoimmune process and post inflammatory/infectious processes. 2. Chiari 1 malformation. 3. 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 Neuro interventional consultation suggested.  She was seen by Dr. Karenann Cai for consultation on 05/03/21 and she is here today for diagnostic cerebral angiography.  She is NPO. She has a headache today and is complaining of the bed not being comfortable and causing back pain. No nausea/vomiting. No Fever/chills. ROS negative.   Past Medical History:  Diagnosis Date   Anesthesia complication    slow to awaken with past procedures, also caused nausea   Cervical pain    Cervical post-laminectomy syndrome    Chronic pain syndrome    COPD (chronic obstructive pulmonary disease) (HCC)    Degenerative lumbar disc    Emphysema lung (HCC)    GERD (gastroesophageal reflux disease)    H/O ulcer disease    High cholesterol    Migraine    Osteoarthritis    Spinal stenosis    Thoracic spine pain     Past  Surgical History:  Procedure Laterality Date   APPENDECTOMY     BREAST CYST EXCISION Bilateral yrs ago   3 areas on the right and 1 on left   CATARACT EXTRACTION Right 03/14/2019   CATARACT EXTRACTION Left 03/21/2019   Hysterectomy Complete     NECK SURGERY  06/2013   plating w/ screws    TONSILLECTOMY      Allergies: Cefdinir, Codeine, Sulfa antibiotics, and Hydrocodone-acetaminophen  Medications: Prior to Admission medications   Medication Sig Start Date End Date Taking? Authorizing Provider  acetaminophen (TYLENOL) 500 MG tablet Take 500 mg by mouth every 6 (six) hours as needed for moderate pain.   Yes [provider]  atorvastatin (LIPITOR) 40 MG tablet Take 40 mg by mouth daily. 03/27/17  Yes [provider]  budesonide-formoterol (SYMBICORT) 160-4.5 MCG/ACT inhaler Inhale 1-2 puffs into the lungs 3 (three) times daily.   Yes [provider]  celecoxib (CELEBREX) 200 MG capsule Take 200 mg by mouth daily as needed for moderate pain. 03/27/17  Yes [provider]  clonazePAM (KLONOPIN) 0.5 MG tablet Take 0.5 mg by mouth 2 (two) times daily as needed for anxiety.   Yes [provider]  cyclobenzaprine (FLEXERIL) 10 MG tablet Take 10 mg by mouth 2 (two) times daily as needed for muscle spasms. 03/27/17  Yes [provider]  desloratadine (CLARINEX) 5 MG tablet Take 5 mg by mouth daily as needed for allergies. 04/23/21  Yes [provider]  esomeprazole (NEXIUM) 40 MG capsule Take 40 mg by mouth 2 (two) times daily. 03/27/17  Yes [provider]  fluticasone (FLONASE) 50 MCG/ACT nasal spray Place 2  sprays into both nostrils daily as needed for allergies. 03/27/17  Yes [provider]  folic acid-pyridoxine-cyancobalamin (FOLTX) 2.5-25-2 MG TABS tablet Take 1 tablet by mouth daily. 01/28/21  Yes Melvenia Beam, MD  gabapentin (NEURONTIN) 300 MG capsule Take 600 capsules by mouth 3 (three) times daily. 03/27/17   Yes [provider]  loratadine (CLARITIN) 10 MG tablet Take 10 mg by mouth daily as needed for allergies. 02/27/17  Yes [provider]  lubiprostone (AMITIZA) 8 MCG capsule Take 8 mcg by mouth every other day. 08/19/19  Yes [provider]  meloxicam (MOBIC) 7.5 MG tablet Take 7.5 mg by mouth 2 (two) times daily as needed for pain. 04/10/21  Yes [provider]  oxyCODONE-acetaminophen (PERCOCET/ROXICET) 5-325 MG tablet Take 1 tablet by mouth 3 (three) times daily as needed for moderate pain. 04/02/17  Yes [provider]  OXYGEN Inhale 2 L into the lungs. While at home   Yes [provider]  Polyethyl Glycol-Propyl Glycol (SYSTANE OP) Place 1 drop into both eyes 2 (two) times daily.   Yes [provider]  SUMAtriptan (IMITREX) 100 MG tablet Take 1 tablet by mouth once as needed for migraine. May take a second dose after 2 hours if needed. 03/27/17  Yes [provider]     Family History  Problem Relation Age of Onset   Cancer Mother        multiple myeloma   Heart disease Mother    Hypertension Mother    Aneurysm Mother        thoracic   Cancer Father        lung cancer   Cancer Brother    Breast cancer Neg Hx    Neuropathy Neg Hx     Social History   Socioeconomic History   Marital status: Single    Spouse name: Not on file   Number of children: 2   Years of education: Not on file   Highest education level: Some college, no degree  Occupational History   Not on file  Tobacco Use   Smoking status: Every Day    Packs/day: 0.50    Years: 36.00    Pack years: 18.00    Types: Cigarettes   Smokeless tobacco: Never  Vaping Use   Vaping Use: Never used  Substance and Sexual Activity   Alcohol use: No   Drug use: No   Sexual activity: Not on file  Other Topics Concern   Not on file  Social History Narrative   Right handed   Lives at home with buddy swaney   Social Determinants of Health   Financial  Resource Strain: Not on file  Food Insecurity: Not on file  Transportation Needs: Not on file  Physical Activity: Not on file  Stress: Not on file  Social Connections: Not on file     Review of Systems: A 12 point ROS discussed and pertinent positives are indicated in the HPI above.  All other systems are negative.  Review of Systems  Vital Signs: BP 122/72    Pulse 93    Temp 98 F (36.7 C) (Oral)    Resp 17    Ht 5' 2"  (1.575 m)    Wt 131 lb (59.4 kg)    SpO2 98%    BMI 23.96 kg/m   Physical Exam Vitals reviewed.  Constitutional:      Appearance: Normal appearance.  HENT:     Head: Normocephalic and atraumatic.  Eyes:  Extraocular Movements: Extraocular movements intact.  Cardiovascular:     Rate and Rhythm: Normal rate and regular rhythm.  Pulmonary:     Effort: Pulmonary effort is normal. No respiratory distress.     Breath sounds: Normal breath sounds.  Abdominal:     Palpations: Abdomen is soft.  Musculoskeletal:        General: Normal range of motion.     Cervical back: Normal range of motion.  Skin:    General: Skin is warm and dry.  Neurological:     General: No focal deficit present.     Mental Status: She is alert and oriented to person, place, and time.  Psychiatric:        Mood and Affect: Mood normal.        Behavior: Behavior normal.        Thought Content: Thought content normal.        Judgment: Judgment normal.    Imaging: MR BRAIN W WO CONTRAST  Result Date: 05/01/2021 CLINICAL DATA:  Unspecified nystagmus H55.00 (ICD-10-CM). EXAM: MRI HEAD WITHOUT AND WITH CONTRAST TECHNIQUE: Multiplanar, multiecho pulse sequences of the brain and surrounding structures were obtained without and with intravenous contrast. CONTRAST:  3m MULTIHANCE GADOBENATE DIMEGLUMINE 529 MG/ML IV SOLN COMPARISON:  MRI of the brain April 20, 2017. FINDINGS: Brain: No acute infarction, hemorrhage, hydrocephalus, extra-axial collection or mass lesion. Scattered foci of T2  hyperintensity are seen within the white matter of the cerebral hemispheres, nonspecific with multiple new or enlarged foci since prior MRI. Mild parenchymal volume loss. Low lying cerebellar tonsils consistent with Chiari 1 malformation. No focus of abnormal contrast enhancement. 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 Skull and upper cervical spine: No focal marrow lesion identified. Sinuses/Orbits: Bilateral lens surgery. Paranasal sinuses are clear. Other: None. IMPRESSION: 1. Mild chronic white matter lesions in a nonspecific distribution, mildly progressed from prior MRI, most likely related to chronic microangiopathy. Differential diagnosis include demyelinating disease, autoimmune process and post inflammatory/infectious processes. 2. Chiari 1 malformation. 3. 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 Neuro interventional consultation suggested. Electronically Signed   By: KPedro EarlsM.D.   On: 05/01/2021 16:05    Labs:  CBC: Recent Labs    05/17/21 0655  WBC 9.8  HGB 14.4  HCT 42.1  PLT 271    COAGS: Recent Labs    05/17/21 0655  INR 1.0    BMP: Recent Labs    05/17/21 0655  NA 139  K 3.7  CL 101  CO2 29  GLUCOSE 92  BUN 5*  CALCIUM 9.2  CREATININE 0.77  GFRNONAA >60    LIVER FUNCTION TESTS: No results for input(s): BILITOT, AST, ALT, ALKPHOS, PROT, ALBUMIN in the last 8760 hours.  TUMOR MARKERS: No results for input(s): AFPTM, CEA, CA199, CHROMGRNA in the last 8760 hours.  Assessment and Plan:  Headache and nystagmus  1. Mild chronic white matter lesions in a nonspecific distribution, mildly progressed from prior MRI, most likely related to chronic microangiopathy. Differential diagnosis include demyelinating disease, autoimmune process and post inflammatory/infectious processes. 2. Chiari 1 malformation. 3. 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   Will proceed with diagnostic cerebral angiography today by Dr. dKarenann Cai  Risks and benefits of cerebral angiogram with intervention were discussed with the patient including, but not limited to bleeding, infection, vascular injury, contrast induced renal failure, stroke or  even death.  This interventional procedure involves the use of X-rays and because of the nature of the planned procedure, it is possible that we will have prolonged use of X-ray fluoroscopy.  Potential radiation risks to you include (but are not limited to) the following: - A slightly elevated risk for cancer  several years later in life. This risk is typically less than 0.5% percent. This risk is low in comparison to the normal incidence of human cancer, which is 33% for women and 50% for men according to the [place]. - Radiation induced injury can include skin redness, resembling a rash, tissue breakdown / ulcers and hair loss (which can be temporary or permanent).   The likelihood of either of these occurring depends on the difficulty of the procedure and whether you are sensitive to radiation due to previous procedures, disease, or genetic conditions.   IF your procedure requires a prolonged use of radiation, you will be notified and given written instructions for further action.  It is your responsibility to monitor the irradiated area for the 2 weeks following the procedure and to notify your physician if you are concerned that you have suffered a radiation induced injury.    All of the patient's questions were answered, patient is agreeable to proceed.  Consent signed and in chart.  Thank you for allowing our service to participate in Tamara [name] 's care.  Electronically Signed: Murrell Redden, PA-C   05/17/2021, 8:06 AM      I spent a total of    15 Minutes in face to face in clinical consultation, greater than 50% of which  was counseling/coordinating care for cerebral angiography.

## 2021-05-17 NOTE — Telephone Encounter (Signed)
Called pt, left VM for her to call to schedule consult with Dr. Quay Burow to discuss angio findings and tx options. JM ?

## 2021-05-22 ENCOUNTER — Ambulatory Visit (HOSPITAL_COMMUNITY)
Admission: RE | Admit: 2021-05-22 | Discharge: 2021-05-22 | Disposition: A | Discharge status: Home / Self Care | Payer: Managed Care, Other (non HMO) | Source: Ambulatory Visit | Attending: Neuroradiology | Admitting: Neuroradiology

## 2021-05-22 ENCOUNTER — Other Ambulatory Visit: Payer: Self-pay

## 2021-05-22 DIAGNOSIS — I671 Cerebral aneurysm, nonruptured: Secondary | ICD-10-CM

## 2021-05-24 NOTE — Consult Note (Signed)
? ?Referring Physician(s): ?de Macedo Rodrigues,Jesse Hirst ? ?Chief Complaint: ?The patient is seen in follow up today s/p cerebral angiogram. ? ?History of present illness: ? ?Tamara [name] is a 61 year old female with incidental left ICA ophthalmic segment aneurysm found on MRI of the brain performed for nystagmus workup.  Patient refers ?jumpy" eyes with worsening of vision and worsening headache for approximately 6 months.  She sought an appointment with ophthalmology for new glasses prescription.  However, eye contractions have not improved. ? ?Her past medical history significant for migraines, hyperlipidemia, tobacco abuse (20 pack-year), chronic pain syndrome, cervical disc disorder, cervical and thoracic and lumbar disc disease and pain, cervical postlaminectomy syndrome, osteoarthritis and COPD. ? ?Family history is significant for father with ruptured brain aneurysm, father and brother with abdominal aneurysm (uncertain what vessel) and mother with thoracic aorta aneurysm.  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  ? ?She underwent a diagnostic cerebral angiogram on 05/17/2021.  The angiogram confirmed a 5 mm left ICA ophthalmic segment aneurysm involving the origin of the left of ophthalmic artery.  She comes today to discuss angiogram findings.  ? ?Past Medical History:  ?Diagnosis Date  ? Anesthesia complication   ? slow to awaken with past procedures, also caused nausea  ? Cervical pain   ? Cervical post-laminectomy syndrome   ? Chronic pain syndrome   ? COPD (chronic obstructive pulmonary disease) ([place])   ? Degenerative lumbar disc   ? Emphysema lung ([place])   ? GERD (gastroesophageal reflux disease)   ? H/O ulcer disease   ? High cholesterol   ? Migraine   ? Osteoarthritis   ? Spinal stenosis   ? Thoracic spine pain   ? ? ?Past Surgical History:  ?Procedure  Laterality Date  ? APPENDECTOMY    ? BREAST CYST EXCISION Bilateral yrs ago  ? 3 areas on the right and 1 on left  ? CATARACT EXTRACTION Right 03/14/2019  ? CATARACT EXTRACTION Left 03/21/2019  ? Hysterectomy Complete    ? IR ANGIO INTRA EXTRACRAN SEL INTERNAL CAROTID BILAT MOD SED  05/17/2021  ? IR ANGIO VERTEBRAL SEL VERTEBRAL BILAT MOD SED  05/17/2021  ? IR US GUIDE VASC ACCESS RIGHT  05/17/2021  ? NECK SURGERY  06/2013  ? plating w/ screws   ? TONSILLECTOMY    ? ? ?Allergies: ?Cefdinir, Codeine, Sulfa antibiotics, and Hydrocodone-acetaminophen ? ?Medications: ?Prior to Admission medications   ?Medication Sig Start Date End Date Taking? Authorizing Provider  ?acetaminophen (TYLENOL) 500 MG tablet Take 500 mg by mouth every 6 (six) hours as needed for moderate pain.    [provider]  ?atorvastatin (LIPITOR) 40 MG tablet Take 40 mg by mouth daily. 03/27/17   [provider]  ?budesonide-formoterol (SYMBICORT) 160-4.5 MCG/ACT inhaler Inhale 1-2 puffs into the lungs 3 (three) times daily.    [provider]  ?celecoxib (CELEBREX) 200 MG capsule Take 200 mg by mouth daily as needed for moderate pain. 03/27/17   [provider]  ?clonazePAM (KLONOPIN) 0.5 MG tablet Take 0.5 mg by mouth 2 (two) times daily as needed for anxiety.    [provider]  ?cyclobenzaprine (FLEXERIL) 10 MG tablet Take 10 mg by mouth 2 (two) times daily as needed for muscle spasms. 03/27/17   [provider]  ?desloratadine (CLARINEX) 5 MG tablet Take 5 mg by mouth daily as needed for allergies. 04/23/21  [provider]  ?esomeprazole (NEXIUM) 40 MG capsule Take 40 mg by mouth 2 (two) times daily. 03/27/17   [provider]  ?fluticasone (FLONASE) 50 MCG/ACT nasal spray Place 2 sprays into both nostrils daily as needed for allergies. 03/27/17   [provider]  ?folic acid-pyridoxine-cyancobalamin (FOLTX) 2.5-25-2 MG TABS tablet Take 1 tablet by mouth daily. 01/28/21    Melvenia Beam, MD  ?gabapentin (NEURONTIN) 300 MG capsule Take 600 capsules by mouth 3 (three) times daily. 03/27/17   [provider]  ?loratadine (CLARITIN) 10 MG tablet Take 10 mg by mouth daily as needed for allergies. 02/27/17   [provider]  ?lubiprostone (AMITIZA) 8 MCG capsule Take 8 mcg by mouth every other day. 08/19/19   [provider]  ?meloxicam (MOBIC) 7.5 MG tablet Take 7.5 mg by mouth 2 (two) times daily as needed for pain. 04/10/21   [provider]  ?oxyCODONE-acetaminophen (PERCOCET/ROXICET) 5-325 MG tablet Take 1 tablet by mouth 3 (three) times daily as needed for moderate pain. 04/02/17   [provider]  ?OXYGEN Inhale 2 L into the lungs. While at home    [provider]  ?Polyethyl Glycol-Propyl Glycol (SYSTANE OP) Place 1 drop into both eyes 2 (two) times daily.    [provider]  ?SUMAtriptan (IMITREX) 100 MG tablet Take 1 tablet by mouth once as needed for migraine. May take a second dose after 2 hours if needed. 03/27/17   [provider]  ?  ? ?Family History  ?Problem Relation Age of Onset  ? Cancer Mother   ?     multiple myeloma  ? Heart disease Mother   ? Hypertension Mother   ? Aneurysm Mother   ?     thoracic  ? Cancer Father   ?     lung cancer  ? Cancer Brother   ? Breast cancer Neg Hx   ? Neuropathy Neg Hx   ? ? ?Social History  ? ?Socioeconomic History  ? Marital status: Single  ?  Spouse name: Not on file  ? Number of children: 2  ? Years of education: Not on file  ? Highest education level: Some college, no degree  ?Occupational History  ? Not on file  ?Tobacco Use  ? Smoking status: Every Day  ?  Packs/day: 0.50  ?  Years: 36.00  ?  Pack years: 18.00  ?  Types: Cigarettes  ? Smokeless tobacco: Never  ?Vaping Use  ? Vaping Use: Never used  ?Substance and Sexual Activity  ? Alcohol use: No  ? Drug use: No  ? Sexual activity: Not on file  ?Other Topics Concern  ? Not on file  ?Social History Narrative  ?  Right handed  ? Lives at home with buddy swaney  ? ?Social Determinants of Health  ? ?Financial Resource Strain: Not on file  ?Food Insecurity: Not on file  ?Transportation Needs: Not on file  ?Physical Activity: Not on file  ?Stress: Not on file  ?Social Connections: Not on file  ? ? ? ?Vital Signs: ?There were no vitals taken for this visit. ? ?Physical Exam ?Constitutional:   ?   Appearance: Normal appearance.  ?Musculoskeletal:  ?     Arms: ? ?Neurological:  ?   Mental Status: She is alert and oriented to person, place, and time.  ?   Cranial Nerves: Cranial nerves 2-12 are intact.  ?   Sensory: Sensation is intact.  ?   Motor: Motor  function is intact.  ?   Gait: Gait is intact.  ? ? ?Imaging: ?No results found. ? ?Labs: ? ?CBC: ?Recent Labs  ?  05/17/21 ?0655  ?WBC 9.8  ?HGB 14.4  ?HCT 42.1  ?PLT 271  ? ? ?COAGS: ?Recent Labs  ?  05/17/21 ?0655  ?INR 1.0  ? ? ?BMP: ?Recent Labs  ?  05/17/21 ?0655  ?NA 139  ?K 3.7  ?CL 101  ?CO2 29  ?GLUCOSE 92  ?BUN 5*  ?CALCIUM 9.2  ?CREATININE 0.77  ?GFRNONAA >60  ? ? ?LIVER FUNCTION TESTS: ?No results for input(s): BILITOT, AST, ALT, ALKPHOS, PROT, ALBUMIN in the last 8760 hours. ? ?Assessment: ? ?I had a lengthy discussion with Tamara [name] about angiogram findings and management options.  Her left ophthalmic aneurysm is amenable to endovascular treatment.  Options of open neuro surgical clipping and continued follow-up were discussed.  Given her family history of aneurysmal subarachnoid hemorrhage and personal history of smoking, I recommended endovascular treatment with a flow diverter.  I explained the risks and benefits of  this approach and the need for dual anti-platelet therapy for approximately 6 months.  All questions were answered to her satisfaction.  Tamara [name] would like to think about the options and she will reach out to [place] once she has made a decision.  ? ?Signed: ?Pedro Earls, MD ?05/24/2021, 12:57 PM ? ? ? ?I spent a total of    40 Minutes  in face to face in clinical consultation, greater than 50% of which was counseling/coordinating care for brain aneurysm. ?  ?

## 2021-05-28 ENCOUNTER — Telehealth (HOSPITAL_COMMUNITY): Payer: Self-pay

## 2021-05-28 NOTE — Telephone Encounter (Signed)
Called pt to see if she decided on scheduling treatment, no answer, left vm. AW  ?

## 2021-06-10 ENCOUNTER — Other Ambulatory Visit (HOSPITAL_COMMUNITY): Payer: Self-pay | Admitting: Neuroradiology

## 2021-06-10 DIAGNOSIS — I671 Cerebral aneurysm, nonruptured: Secondary | ICD-10-CM

## 2021-06-13 ENCOUNTER — Telehealth (HOSPITAL_COMMUNITY): Payer: Self-pay | Admitting: Radiology

## 2021-06-13 NOTE — Telephone Encounter (Signed)
Called pt to schedule procedure with Dr. Debbrah Alar. Left VM for her to call me back. JM ?

## 2021-06-18 ENCOUNTER — Telehealth (HOSPITAL_COMMUNITY): Payer: Self-pay | Admitting: Radiology

## 2021-06-18 NOTE — Telephone Encounter (Signed)
Called pt to schedule her aneurysm tx with Dr. Quay Burow. Left VM for her to call me back. JM ?

## 2021-06-18 NOTE — Telephone Encounter (Signed)
Pt called and has decided to wait on having her brain aneurysm treated. She says she owes too much money for her doctor bills now. JM ?

## 2021-08-12 ENCOUNTER — Other Ambulatory Visit: Payer: Self-pay | Admitting: *Deleted

## 2021-08-12 DIAGNOSIS — Z87891 Personal history of nicotine dependence: Secondary | ICD-10-CM

## 2021-08-12 DIAGNOSIS — Z122 Encounter for screening for malignant neoplasm of respiratory organs: Secondary | ICD-10-CM

## 2021-08-12 DIAGNOSIS — F17210 Nicotine dependence, cigarettes, uncomplicated: Secondary | ICD-10-CM

## 2021-09-02 ENCOUNTER — Ambulatory Visit
Admission: RE | Admit: 2021-09-02 | Discharge: 2021-09-02 | Disposition: A | Discharge status: Home / Self Care | Payer: Managed Care, Other (non HMO) | Source: Ambulatory Visit | Attending: Acute Care | Admitting: Acute Care

## 2021-09-02 DIAGNOSIS — F17210 Nicotine dependence, cigarettes, uncomplicated: Secondary | ICD-10-CM | POA: Diagnosis present

## 2021-09-02 DIAGNOSIS — Z122 Encounter for screening for malignant neoplasm of respiratory organs: Secondary | ICD-10-CM | POA: Insufficient documentation

## 2021-09-02 DIAGNOSIS — Z87891 Personal history of nicotine dependence: Secondary | ICD-10-CM | POA: Diagnosis present

## 2021-09-03 ENCOUNTER — Other Ambulatory Visit: Payer: Self-pay | Admitting: Acute Care

## 2021-09-03 DIAGNOSIS — F17210 Nicotine dependence, cigarettes, uncomplicated: Secondary | ICD-10-CM

## 2021-09-03 DIAGNOSIS — Z122 Encounter for screening for malignant neoplasm of respiratory organs: Secondary | ICD-10-CM

## 2021-09-03 DIAGNOSIS — Z87891 Personal history of nicotine dependence: Secondary | ICD-10-CM

## 2021-11-21 ENCOUNTER — Telehealth: Payer: Self-pay

## 2021-11-21 NOTE — Telephone Encounter (Signed)
[facility] has faxed over progress notes for pt. Placed on MD desk for review.

## 2021-12-05 ENCOUNTER — Other Ambulatory Visit: Payer: Self-pay | Admitting: Gastroenterology

## 2021-12-05 DIAGNOSIS — R1314 Dysphagia, pharyngoesophageal phase: Secondary | ICD-10-CM

## 2021-12-05 DIAGNOSIS — Z8719 Personal history of other diseases of the digestive system: Secondary | ICD-10-CM

## 2021-12-05 DIAGNOSIS — K224 Dyskinesia of esophagus: Secondary | ICD-10-CM

## 2021-12-05 DIAGNOSIS — K219 Gastro-esophageal reflux disease without esophagitis: Secondary | ICD-10-CM

## 2022-01-01 ENCOUNTER — Other Ambulatory Visit: Payer: Self-pay | Admitting: Internal Medicine

## 2022-01-01 DIAGNOSIS — Z1231 Encounter for screening mammogram for malignant neoplasm of breast: Secondary | ICD-10-CM

## 2022-01-06 ENCOUNTER — Encounter (INDEPENDENT_AMBULATORY_CARE_PROVIDER_SITE_OTHER): Payer: Self-pay

## 2022-02-07 ENCOUNTER — Ambulatory Visit
Admission: RE | Admit: 2022-02-07 | Discharge: 2022-02-07 | Disposition: A | Discharge status: Home / Self Care | Payer: BC Managed Care – PPO | Source: Ambulatory Visit | Attending: Internal Medicine | Admitting: Internal Medicine

## 2022-02-07 DIAGNOSIS — Z1231 Encounter for screening mammogram for malignant neoplasm of breast: Secondary | ICD-10-CM

## 2022-04-30 DIAGNOSIS — M961 Postlaminectomy syndrome, not elsewhere classified: Secondary | ICD-10-CM | POA: Diagnosis not present

## 2022-04-30 DIAGNOSIS — M5030 Other cervical disc degeneration, unspecified cervical region: Secondary | ICD-10-CM | POA: Diagnosis not present

## 2022-04-30 DIAGNOSIS — G894 Chronic pain syndrome: Secondary | ICD-10-CM | POA: Diagnosis not present

## 2022-04-30 DIAGNOSIS — M5136 Other intervertebral disc degeneration, lumbar region: Secondary | ICD-10-CM | POA: Diagnosis not present

## 2022-05-22 ENCOUNTER — Other Ambulatory Visit: Payer: Self-pay

## 2022-05-26 ENCOUNTER — Ambulatory Visit: Payer: Self-pay | Admitting: Gastroenterology

## 2022-05-26 ENCOUNTER — Encounter: Payer: Self-pay | Admitting: Gastroenterology

## 2022-05-26 ENCOUNTER — Telehealth: Payer: Self-pay

## 2022-05-26 NOTE — Telephone Encounter (Signed)
Per note she is transferring from [facility]

## 2022-05-26 NOTE — Telephone Encounter (Signed)
Patient has been seeing [facility] and last appointment was 12/05/2021. Please find out why patient is transferring care to our office before she can be seen and get the providers approval.

## 2022-05-29 DIAGNOSIS — R0601 Orthopnea: Secondary | ICD-10-CM | POA: Diagnosis not present

## 2022-07-02 DIAGNOSIS — E7800 Pure hypercholesterolemia, unspecified: Secondary | ICD-10-CM | POA: Diagnosis not present

## 2022-07-02 DIAGNOSIS — R7303 Prediabetes: Secondary | ICD-10-CM | POA: Diagnosis not present

## 2022-07-07 DIAGNOSIS — J9611 Chronic respiratory failure with hypoxia: Secondary | ICD-10-CM | POA: Diagnosis not present

## 2022-07-07 DIAGNOSIS — R7303 Prediabetes: Secondary | ICD-10-CM | POA: Diagnosis not present

## 2022-07-07 DIAGNOSIS — I700 Atherosclerosis of aorta: Secondary | ICD-10-CM | POA: Diagnosis not present

## 2022-07-07 DIAGNOSIS — E7800 Pure hypercholesterolemia, unspecified: Secondary | ICD-10-CM | POA: Diagnosis not present

## 2022-07-07 DIAGNOSIS — F325 Major depressive disorder, single episode, in full remission: Secondary | ICD-10-CM | POA: Diagnosis not present

## 2022-07-07 DIAGNOSIS — G629 Polyneuropathy, unspecified: Secondary | ICD-10-CM | POA: Diagnosis not present

## 2022-07-07 DIAGNOSIS — J449 Chronic obstructive pulmonary disease, unspecified: Secondary | ICD-10-CM | POA: Diagnosis not present

## 2022-07-07 DIAGNOSIS — E538 Deficiency of other specified B group vitamins: Secondary | ICD-10-CM | POA: Diagnosis not present

## 2022-07-31 DIAGNOSIS — J441 Chronic obstructive pulmonary disease with (acute) exacerbation: Secondary | ICD-10-CM | POA: Diagnosis not present

## 2022-07-31 DIAGNOSIS — J449 Chronic obstructive pulmonary disease, unspecified: Secondary | ICD-10-CM | POA: Diagnosis not present

## 2022-08-03 ENCOUNTER — Other Ambulatory Visit: Payer: Self-pay | Admitting: Acute Care

## 2022-08-03 DIAGNOSIS — Z87891 Personal history of nicotine dependence: Secondary | ICD-10-CM

## 2022-08-03 DIAGNOSIS — Z122 Encounter for screening for malignant neoplasm of respiratory organs: Secondary | ICD-10-CM

## 2022-08-03 DIAGNOSIS — F17210 Nicotine dependence, cigarettes, uncomplicated: Secondary | ICD-10-CM

## 2022-08-28 DIAGNOSIS — M5134 Other intervertebral disc degeneration, thoracic region: Secondary | ICD-10-CM | POA: Diagnosis not present

## 2022-08-28 DIAGNOSIS — M5416 Radiculopathy, lumbar region: Secondary | ICD-10-CM | POA: Diagnosis not present

## 2022-08-28 DIAGNOSIS — M5431 Sciatica, right side: Secondary | ICD-10-CM | POA: Diagnosis not present

## 2022-08-28 DIAGNOSIS — G894 Chronic pain syndrome: Secondary | ICD-10-CM | POA: Diagnosis not present

## 2022-08-28 DIAGNOSIS — M5030 Other cervical disc degeneration, unspecified cervical region: Secondary | ICD-10-CM | POA: Diagnosis not present

## 2022-08-28 DIAGNOSIS — M961 Postlaminectomy syndrome, not elsewhere classified: Secondary | ICD-10-CM | POA: Diagnosis not present

## 2022-08-28 DIAGNOSIS — M5136 Other intervertebral disc degeneration, lumbar region: Secondary | ICD-10-CM | POA: Diagnosis not present

## 2022-08-28 DIAGNOSIS — Z79891 Long term (current) use of opiate analgesic: Secondary | ICD-10-CM | POA: Diagnosis not present

## 2022-09-04 ENCOUNTER — Ambulatory Visit: Payer: BC Managed Care – PPO

## 2022-09-24 DIAGNOSIS — I671 Cerebral aneurysm, nonruptured: Secondary | ICD-10-CM | POA: Diagnosis not present

## 2022-10-30 DIAGNOSIS — R251 Tremor, unspecified: Secondary | ICD-10-CM | POA: Diagnosis not present

## 2022-10-30 DIAGNOSIS — R413 Other amnesia: Secondary | ICD-10-CM | POA: Diagnosis not present

## 2022-10-30 DIAGNOSIS — I671 Cerebral aneurysm, nonruptured: Secondary | ICD-10-CM | POA: Diagnosis not present

## 2022-12-02 DIAGNOSIS — N898 Other specified noninflammatory disorders of vagina: Secondary | ICD-10-CM | POA: Diagnosis not present

## 2022-12-24 DIAGNOSIS — Z961 Presence of intraocular lens: Secondary | ICD-10-CM | POA: Diagnosis not present

## 2022-12-29 DIAGNOSIS — Z5181 Encounter for therapeutic drug level monitoring: Secondary | ICD-10-CM | POA: Diagnosis not present

## 2022-12-29 DIAGNOSIS — Z79899 Other long term (current) drug therapy: Secondary | ICD-10-CM | POA: Diagnosis not present

## 2022-12-31 DIAGNOSIS — R7303 Prediabetes: Secondary | ICD-10-CM | POA: Diagnosis not present

## 2022-12-31 DIAGNOSIS — I700 Atherosclerosis of aorta: Secondary | ICD-10-CM | POA: Diagnosis not present

## 2023-01-07 DIAGNOSIS — I700 Atherosclerosis of aorta: Secondary | ICD-10-CM | POA: Diagnosis not present

## 2023-01-07 DIAGNOSIS — J9611 Chronic respiratory failure with hypoxia: Secondary | ICD-10-CM | POA: Diagnosis not present

## 2023-01-07 DIAGNOSIS — Z2821 Immunization not carried out because of patient refusal: Secondary | ICD-10-CM | POA: Diagnosis not present

## 2023-01-07 DIAGNOSIS — R7303 Prediabetes: Secondary | ICD-10-CM | POA: Diagnosis not present

## 2023-01-07 DIAGNOSIS — F325 Major depressive disorder, single episode, in full remission: Secondary | ICD-10-CM | POA: Diagnosis not present

## 2023-01-07 DIAGNOSIS — Z0000 Encounter for general adult medical examination without abnormal findings: Secondary | ICD-10-CM | POA: Diagnosis not present

## 2023-12-07 ENCOUNTER — Other Ambulatory Visit: Payer: Self-pay | Admitting: Acute Care

## 2023-12-07 DIAGNOSIS — Z87891 Personal history of nicotine dependence: Secondary | ICD-10-CM

## 2023-12-07 DIAGNOSIS — F17210 Nicotine dependence, cigarettes, uncomplicated: Secondary | ICD-10-CM

## 2023-12-07 DIAGNOSIS — Z122 Encounter for screening for malignant neoplasm of respiratory organs: Secondary | ICD-10-CM

## 2024-01-25 ENCOUNTER — Ambulatory Visit
Admission: RE | Admit: 2024-01-25 | Discharge: 2024-01-25 | Disposition: A | Discharge status: Home / Self Care | Source: Ambulatory Visit | Attending: Acute Care | Admitting: Acute Care

## 2024-01-25 DIAGNOSIS — Z122 Encounter for screening for malignant neoplasm of respiratory organs: Secondary | ICD-10-CM | POA: Diagnosis present

## 2024-01-25 DIAGNOSIS — F17210 Nicotine dependence, cigarettes, uncomplicated: Secondary | ICD-10-CM | POA: Diagnosis present

## 2024-01-25 DIAGNOSIS — Z87891 Personal history of nicotine dependence: Secondary | ICD-10-CM | POA: Insufficient documentation

## 2024-02-01 ENCOUNTER — Telehealth: Payer: Self-pay

## 2024-02-01 ENCOUNTER — Telehealth: Payer: Self-pay | Admitting: Acute Care

## 2024-02-01 NOTE — Telephone Encounter (Signed)
 Call Report from [facility]  IMPRESSION: 1. New 6.2 mm nodule in the superior segment left lower lobe. Lung-RADS 4A, suspicious. Follow up low-dose chest CT without contrast in 3 months (please use the following order, CT CHEST LCS NODULE FOLLOW-UP W/O CM) is recommended. Alternatively, PET may be considered when there is a solid component 8mm or larger. These results will be called to the ordering clinician or representative by the Radiologist Assistant, and communication documented in the PACS or [employer]. 2.  Aortic atherosclerosis (ICD10-I70.0). 3.  Emphysema (ICD10-J43.9).

## 2024-02-01 NOTE — Telephone Encounter (Signed)
 3 month follow up here is fine. Too small to PET, but looks suspicious. Due mid 04/2024. She is an [employer] patient  Please let PCP know plan.  Aortic atherosclerosis >> on statin, not seen by cards.  Emphysema  Thanks so much

## 2024-02-02 NOTE — Telephone Encounter (Signed)
 Returned call from VM but had to leave another VM for pt to call back.

## 2024-02-02 NOTE — Telephone Encounter (Signed)
 Called patient to review recent Lung CT results. No answer, LVM to call office.

## 2024-02-08 ENCOUNTER — Other Ambulatory Visit: Payer: Self-pay

## 2024-02-08 DIAGNOSIS — Z122 Encounter for screening for malignant neoplasm of respiratory organs: Secondary | ICD-10-CM

## 2024-02-08 DIAGNOSIS — R911 Solitary pulmonary nodule: Secondary | ICD-10-CM

## 2024-02-08 DIAGNOSIS — F17210 Nicotine dependence, cigarettes, uncomplicated: Secondary | ICD-10-CM

## 2024-02-08 DIAGNOSIS — Z87891 Personal history of nicotine dependence: Secondary | ICD-10-CM

## 2024-02-08 NOTE — Telephone Encounter (Signed)
 Spoke with patient and reviewed recent Lung CT results. She will complete a 3 month follow up 04/2024. She will have new insurance at that time. Request call back closer to due date to schedule and she will update insurance at that time. CT order placed. Results and plan to PCP.

## 2024-02-19 IMAGING — US IR CAROTID INTERNAL HEAD/NECK BILAT  (MS)
1 series · 1 of 1 positions shown · non-contrast
Comparison: MRI of the brain April 30, 2021.

INDICATION: Esgar Cast is a 60-year-old female with incidental left ICA
ophthalmic segment aneurysm found on MRI of the brain performed for
nystagmus workup. Her past medical history significant for
migraines, hyperlipidemia, tobacco abuse (20 pack-year), chronic
pain syndrome, cervical disc disorder, cervical and thoracic and
lumbar disc disease and pain, cervical postlaminectomy syndrome,
osteoarthritis and COPD.Family history is significant for father
with ruptured brain aneurysm, father and brother with abdominal
aneurysm (uncertain what vessel) and mother with thoracic aorta
aneurysm. MRI performed on 04/30/2021 was positive for ectatic
supraclinoid left ICA with associated 5 mm of ophthalmic segment
aneurysm in addition to mild chronic white matter disease. She comes
to our service today for diagnostic cerebral angiogram to better
evaluate MRI findings.

EXAM:
ULTRASOUND-GUIDED VASCULAR [REDACTED] CEREBRAL ANGIOGRAM
TECHNIQUE: Informed written consent was obtained from the patient after a
thorough discussion of the procedural risks, benefits and
alternatives. All questions were addressed.

[Series 1: ir (id) (id) · 1 of 1 slices shown]
[im 1/1]
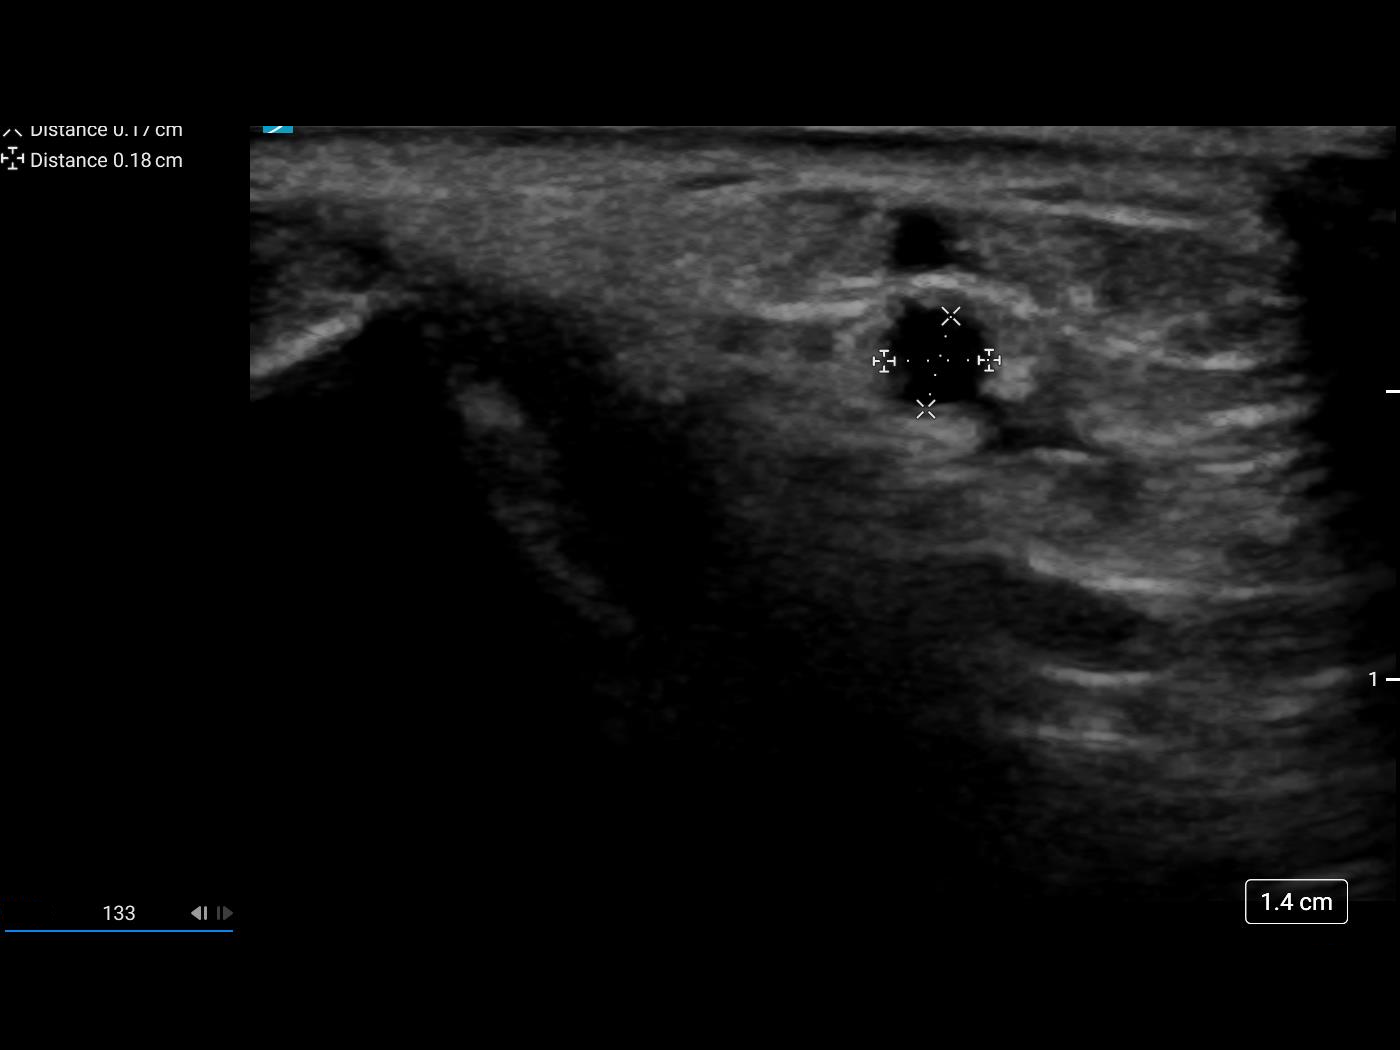

[1 of 1 positions shown; findings below may reference images not displayed]

MEDICATIONS:
5,000 BRUTUS heparin, 5 mg Verapamil and 200 mcg nitroglycerin IA.

ANESTHESIA/SEDATION:
Moderate (conscious) sedation was employed during this procedure. A
total of Versed 1.5 mg and Fentanyl 50 mcg was administered
intravenously by the radiology nurse.

Total intra-service moderate Sedation Time: 50 minutes. The
patient's level of consciousness and vital signs were monitored
continuously by radiology nursing throughout the procedure under my
direct supervision.

CONTRAST:  80 mL of Omnipaque 300 milligram/mL.

FLUOROSCOPY:
Radiation Exposure Index (as provided by the fluoroscopic device):
327 mGy Kerma

COMPLICATIONS:
None immediate.
Maximal Sterile Barrier Technique was utilized including caps, mask,
sterile gowns, sterile gloves, sterile drape, hand hygiene and skin
antiseptic. A timeout was performed prior to the initiation of the
procedure.

Using the modified Seldinger technique and a micropuncture kit,
access was gained to the right radial artery at the wrist and a 5
French sheath was placed. Real-time ultrasound guidance was utilized
for vascular access including the acquisition of a permanent
ultrasound image documenting patency of the accessed vessel. Slow
intra arterial infusion of 5,000 BRUTUS heparin, 5 mg Verapamil and 200
mcg nitroglycerin diluted in patient's own blood was performed. No
significant fluctuation in patient's blood pressure seen. Then, a
right radial artery angiogram was obtained via sheath side port. The
right radial artery caliber is adequate for vascular access.

Next, a 5 Zeinab Tiger 2 glide catheter was navigated over a
0.035" Terumo Glidewire into the right subclavian artery under
fluoroscopic guidance. Using a road map guidance, the catheter was
placed into the right vertebral artery. Frontal and lateral
angiograms of the head were obtained.

The catheter was subsequently placed into the left common carotid
artery. Frontal and lateral angiograms of the neck were obtained.

The catheter herniated into the aortic arch and was subsequently
placed into the left subclavian artery. Using roadmap guidance, the
catheter was placed into the left vertebral artery. Frontal and
lateral angiograms of the head were obtained.

The catheter was again placed into the left common carotid artery.
Using biplane roadmap guidance, the catheter was advanced into the
left internal carotid artery. Frontal and lateral angiograms of the
head were obtained followed by multiple magnified oblique angiograms
of the head centered in the intracranial left ICA.

Next, the catheter was placed into the right common carotid artery.
Frontal and lateral angiograms of the neck were obtained. Using
biplane roadmap guidance, the catheter was advanced into the right
internal carotid artery. Frontal and lateral angiograms of the head
were obtained followed by magnified right anterior oblique and
magnified lateral views of the head.

The catheter was subsequently withdrawn.

An inflatable band was placed and inflated over the right wrist
access site. The vascular sheath was withdrawn and the band was
slowly deflated until brisk flow was noted through the arteriotomy
site. At this point, the band was reinflated with additional 3 cc of
air to obtain patent hemostasis.
FINDINGS: Right radial artery ultrasound and right radial artery angiogram:
The caliber of the right radial artery is appropriate for angiogram
access. The right radial artery originates from the axillary artery,
corresponding to an anatomical variant.

Right vertebral artery angiograms: Mild increased tortuosity of the
cervical segment of the right vertebral artery. The right vertebral
artery, basilar artery, and bilateral posterior cerebral arteries
are unremarkable. Luminal caliber is smooth and tapering. No
aneurysms or abnormally high-flow, early draining veins are seen. No
regions of abnormal hypervascularity are noted. The visualized dural
sinuses are patent.

Left CCA angiograms: Cervical angiograms show minimal
atherosclerotic changes of the left carotid bifurcation without
hemodynamically significant stenosis. There is increased tortuosity
of the mid cervical segment with arterial kinking.

Left ICA angiograms: There is a 5 x 4 mm ophthalmic segment right
ICA aneurysm involving the origin of the right ophthalmic artery.
There is brisk vascular contrast filling of the left ACA and MCA
vascular trees. Luminal caliber is smooth and tapering. No
abnormally high-flow, early draining veins are seen. No regions of
abnormal hypervascularity are noted. The visualized dural sinuses
are patent.

Left vertebral artery angiograms: The left vertebral artery, basilar
artery, and bilateral posterior cerebral arteries are unremarkable.
Luminal caliber is smooth and tapering. No aneurysms or abnormally
high-flow, early draining veins are seen. No regions of abnormal
hypervascularity are noted. The visualized dural sinuses are patent.

Right CCA angiograms: Cervical angiograms normal caliber of the
right common carotid artery and cervical right internal carotid
artery with increased tortuosity in the mid cervical segment of the
right ICA with arterial kinking.

Right ICA angiograms: There is brisk vascular contrast filling of
the right ACA and MCA vascular trees. A 1.5 mm infundibulum is noted
at the origin of the left posterior communicating artery. Luminal
caliber is smooth and tapering. No aneurysms or abnormally
high-flow, early draining veins are seen. No regions of abnormal
hypervascularity are noted. The visualized dural sinuses are patent.

PROCEDURE:
No intervention performed.
IMPRESSION: A 5 x 4 mm left ICA ophthalmic segment aneurysm involving the origin
of the left ophthalmic artery. This is amenable to endovascular
treatment. No other aneurysm identified.

PLAN:
Patient will return for consultation to discuss aneurysm management.
